# Patient Record
Sex: Female | Born: 1980 | Race: White | Hispanic: No | Marital: Married | Smoking: Never smoker
Health system: Southern US, Community
[De-identification: ages and names within clinical notes are randomized; demographics above are authoritative.]

## PROBLEM LIST (undated history)

## (undated) DIAGNOSIS — G500 Trigeminal neuralgia: Secondary | ICD-10-CM

## (undated) DIAGNOSIS — C44799 Other specified malignant neoplasm of skin of left lower limb, including hip: Secondary | ICD-10-CM

## (undated) DIAGNOSIS — C44301 Unspecified malignant neoplasm of skin of nose: Secondary | ICD-10-CM

## (undated) DIAGNOSIS — F32A Depression, unspecified: Secondary | ICD-10-CM

## (undated) DIAGNOSIS — F329 Major depressive disorder, single episode, unspecified: Secondary | ICD-10-CM

## (undated) HISTORY — DX: Depression, unspecified: F32.A

## (undated) HISTORY — DX: Major depressive disorder, single episode, unspecified: F32.9

## (undated) HISTORY — DX: Other specified malignant neoplasm of skin of left lower limb, including hip: C44.799

## (undated) HISTORY — PX: OTHER SURGICAL HISTORY: SHX169

## (undated) HISTORY — DX: Trigeminal neuralgia: G50.0

## (undated) HISTORY — DX: Unspecified malignant neoplasm of skin of nose: C44.301

---

## 2017-06-08 ENCOUNTER — Encounter (INDEPENDENT_AMBULATORY_CARE_PROVIDER_SITE_OTHER): Payer: Self-pay

## 2017-06-08 ENCOUNTER — Ambulatory Visit (INDEPENDENT_AMBULATORY_CARE_PROVIDER_SITE_OTHER): Payer: 59 | Admitting: Internal Medicine

## 2017-06-08 ENCOUNTER — Encounter: Payer: Self-pay | Admitting: Internal Medicine

## 2017-06-08 VITALS — BP 104/66 | HR 66 | Temp 98.0°F | Ht 66.0 in | Wt 213.5 lb

## 2017-06-08 DIAGNOSIS — F32A Depression, unspecified: Secondary | ICD-10-CM | POA: Insufficient documentation

## 2017-06-08 DIAGNOSIS — Z808 Family history of malignant neoplasm of other organs or systems: Secondary | ICD-10-CM

## 2017-06-08 DIAGNOSIS — F419 Anxiety disorder, unspecified: Secondary | ICD-10-CM

## 2017-06-08 DIAGNOSIS — F329 Major depressive disorder, single episode, unspecified: Secondary | ICD-10-CM

## 2017-06-08 MED ORDER — CITALOPRAM HYDROBROMIDE 40 MG PO TABS: 40.0000 mg | ORAL_TABLET | Freq: Every day | ORAL | 3 refills | Status: DC

## 2017-06-08 NOTE — Patient Instructions (Signed)

## 2017-06-08 NOTE — Assessment & Plan Note (Signed)
Chronic but stable on Celexa, refilled today Continue Xanax prn

## 2017-06-08 NOTE — Progress Notes (Signed)
HPI  Pt presents to the clinic today to establish care and for management of the conditions listed below. She is transferring care from Dr. Sharlett Iles in [place], [place].  Anxiety and Depression: Triggered by sugars, she also has a family history of anxiety and depression. She is taking Celexa daily as prescribed. She takes Xanax 1-2 times per month. She would like a refill of her Celexa today.  She is requesting a referral to dermatology for a general skin check, given her family history of melanoma.   Flu: never Tetanus: > 10 years ago Pap Smear: ? 2011 Dentist: as needed  Past Medical History:  Diagnosis Date  . Depression   . Dermatofibrosarcoma protuberans of lower extremity, left     Current Outpatient Prescriptions  Medication Sig Dispense Refill  . ALPRAZolam (XANAX) 0.25 MG tablet Take 0.25-0.5 mg by mouth every 6 (six) hours as needed for anxiety.    . citalopram (CELEXA) 40 MG tablet Take 40 mg by mouth daily.     No current facility-administered medications for this visit.     Allergies  Allergen Reactions  . Ceclor [Cefaclor] Hives    Family History  Problem Relation Age of Onset  . Diabetes Mother   . Hypertension Mother   . Depression Mother   . Melanoma Mother   . Stroke Father   . Depression Father   . Hypertension Father   . Melanoma Father   . Dementia Maternal Grandmother   . Depression Maternal Grandmother   . Melanoma Maternal Grandfather   . Heart disease Paternal Grandmother     Social History   Social History  . Marital status: Married    Spouse name: N/A  . Number of children: N/A  . Years of education: N/A   Occupational History  . Not on file.   Social History Main Topics  . Smoking status: Never Smoker  . Smokeless tobacco: Never Used  . Alcohol use No  . Drug use: Unknown  . Sexual activity: Not on file   Other Topics Concern  . Not on file   Social History Narrative  . No narrative on file    ROS:  Constitutional:  Denies fever, malaise, fatigue, headache or abrupt weight changes.  HEENT: Denies eye pain, eye redness, ear pain, ringing in the ears, wax buildup, runny nose, nasal congestion, bloody nose, or sore throat. Respiratory: Denies difficulty breathing, shortness of breath, cough or sputum production.   Cardiovascular: Denies chest pain, chest tightness, palpitations or swelling in the hands or feet.  Gastrointestinal: Denies abdominal pain, bloating, constipation, diarrhea or blood in the stool.  GU: Denies frequency, urgency, pain with urination, blood in urine, odor or discharge. Musculoskeletal: Denies decrease in range of motion, difficulty with gait, muscle pain or joint pain and swelling.  Skin: Denies redness, rashes, lesions or ulcercations.  Neurological: Denies dizziness, difficulty with memory, difficulty with speech or problems with balance and coordination.  Psych: Pt reports anxiety and depression. Denies SI/HI.  No other specific complaints in a complete review of systems (except as listed in HPI above).  PE: BP 104/66   Pulse 66   Temp 98 F (36.7 C) (Oral)   Ht 5\' 6"  (1.676 m)   Wt 213 lb 8 oz (96.8 kg)   LMP 05/18/2017   SpO2 98%   BMI 34.46 kg/m   Wt Readings from Last 3 Encounters:  06/08/17 213 lb 8 oz (96.8 kg)    General: Appears her stated age, obese in NAD.  Skin: Dry and intact. Cardiovascular: Normal rate and rhythm. S1,S2 noted.  No murmur, rubs or gallops noted.  Pulmonary/Chest: Normal effort and positive vesicular breath sounds. No respiratory distress. No wheezes, rales or ronchi noted.  Neurological: Alert and oriented.  Psychiatric: Mood and affect normal. Behavior is normal. Judgment and thought content normal.     Assessment and Plan:  Family History of Melanoma:  Referral to dermatology placed  Make an appt for your annual exam Webb Silversmith, NP

## 2018-06-14 ENCOUNTER — Ambulatory Visit (INDEPENDENT_AMBULATORY_CARE_PROVIDER_SITE_OTHER): Payer: BC Managed Care – PPO | Admitting: Internal Medicine

## 2018-06-14 ENCOUNTER — Encounter: Payer: Self-pay | Admitting: Internal Medicine

## 2018-06-14 DIAGNOSIS — F329 Major depressive disorder, single episode, unspecified: Secondary | ICD-10-CM

## 2018-06-14 DIAGNOSIS — F419 Anxiety disorder, unspecified: Secondary | ICD-10-CM

## 2018-06-14 DIAGNOSIS — F32A Depression, unspecified: Secondary | ICD-10-CM

## 2018-06-14 MED ORDER — ALPRAZOLAM 0.25 MG PO TABS: 0.2500 mg | ORAL_TABLET | Freq: Every day | ORAL | 0 refills | Status: DC | PRN

## 2018-06-14 NOTE — Progress Notes (Signed)
Subjective:    Patient ID: Carmen Mathis, female    DOB: 1981/04/18, 37 y.o.   MRN: [id number]  HPI  Pt presents to the clinic today to follow up anxiety and depression. This is a chronic issue for her. She feels like her symptoms are well controlled on Celexa. She ran out of Xanax last fall and would like a refill of that today. She reports recent increase in stress. She denies SI/HI.  Review of Systems      Past Medical History:  Diagnosis Date  . Depression   . Dermatofibrosarcoma protuberans of lower extremity, left     Current Outpatient Medications  Medication Sig Dispense Refill  . ALPRAZolam (XANAX) 0.25 MG tablet Take 0.25-0.5 mg by mouth every 6 (six) hours as needed for anxiety.    . citalopram (CELEXA) 40 MG tablet Take 1 tablet (40 mg total) by mouth daily. 90 tablet 3   No current facility-administered medications for this visit.     Allergies  Allergen Reactions  . Ceclor [Cefaclor] Hives    Family History  Problem Relation Age of Onset  . Diabetes Mother   . Hypertension Mother   . Depression Mother   . Melanoma Mother   . Stroke Father   . Depression Father   . Hypertension Father   . Melanoma Father   . Dementia Maternal Grandmother   . Depression Maternal Grandmother   . Melanoma Maternal Grandfather   . Heart disease Paternal Grandmother   . Anxiety disorder Sister   . Skin cancer Brother   . Anxiety disorder Daughter   . Diabetes Son     Social History   Socioeconomic History  . Marital status: Married    Spouse name: Not on file  . Number of children: Not on file  . Years of education: Not on file  . Highest education level: Not on file  Occupational History  . Not on file  Social Needs  . Financial resource strain: Not on file  . Food insecurity:    Worry: Not on file    Inability: Not on file  . Transportation needs:    Medical: Not on file    Non-medical: Not on file  Tobacco Use  . Smoking status: Never Smoker  .  Smokeless tobacco: Never Used  Substance and Sexual Activity  . Alcohol use: No  . Drug use: No  . Sexual activity: Yes  Lifestyle  . Physical activity:    Days per week: Not on file    Minutes per session: Not on file  . Stress: Not on file  Relationships  . Social connections:    Talks on phone: Not on file    Gets together: Not on file    Attends religious service: Not on file    Active member of club or organization: Not on file    Attends meetings of clubs or organizations: Not on file    Relationship status: Not on file  . Intimate partner violence:    Fear of current or ex partner: Not on file    Emotionally abused: Not on file    Physically abused: Not on file    Forced sexual activity: Not on file  Other Topics Concern  . Not on file  Social History Narrative  . Not on file     Constitutional: Denies fever, malaise, fatigue, headache or abrupt weight changes.  Psych: Pt reports anxiety and depression. Denies SI/HI.  No other specific complaints in a complete  review of systems (except as listed in HPI above).  Objective:   Physical Exam  Wt 222 lb (100.7 kg)   BMI 35.83 kg/m  Wt Readings from Last 3 Encounters:  06/14/18 222 lb (100.7 kg)  06/08/17 213 lb 8 oz (96.8 kg)    General: Appears her stated age, well developed, well nourished in NAD. Cardiovascular: Normal rate and rhythm. S1,S2 noted.  No murmur, rubs or gallops noted. Pulmonary/Chest: Normal effort and positive vesicular breath sounds. No respiratory distress. No wheezes, rales or ronchi noted.  Neurological: Alert and oriented. Psychiatric: Mood and affect normal. Behavior is normal. Judgment and thought content normal.       Assessment & Plan:

## 2018-06-14 NOTE — Assessment & Plan Note (Signed)
Continue Celexa and Xanax Refilled today Support offered

## 2018-06-14 NOTE — Patient Instructions (Signed)

## 2018-07-24 ENCOUNTER — Other Ambulatory Visit: Payer: Self-pay | Admitting: Internal Medicine

## 2018-11-09 ENCOUNTER — Other Ambulatory Visit: Payer: Self-pay | Admitting: Internal Medicine

## 2018-11-28 ENCOUNTER — Other Ambulatory Visit: Payer: Self-pay | Admitting: Neurology

## 2018-11-28 DIAGNOSIS — G518 Other disorders of facial nerve: Secondary | ICD-10-CM

## 2018-12-07 ENCOUNTER — Ambulatory Visit
Admission: RE | Admit: 2018-12-07 | Discharge: 2018-12-07 | Disposition: A | Discharge status: Home / Self Care | Payer: BC Managed Care – PPO | Source: Ambulatory Visit | Attending: Neurology | Admitting: Neurology

## 2018-12-07 DIAGNOSIS — G518 Other disorders of facial nerve: Secondary | ICD-10-CM | POA: Diagnosis present

## 2018-12-07 MED ORDER — GADOBUTROL 1 MMOL/ML IV SOLN
10.0000 mL | Freq: Once | INTRAVENOUS | Status: AC | PRN
  Administered 2018-12-07: 10 mL via INTRAVENOUS

## 2018-12-25 ENCOUNTER — Telehealth: Payer: Self-pay

## 2018-12-25 NOTE — Telephone Encounter (Signed)
Pt left v/m that pt was seen for annual in 05/2018 and pt last refill for citalopram was for # 30 with no refills. Pt request 3 mth refill on citalopram to [facility] [address] . I do not see where pt had annual exam in 05/2018.Please advise. Pt request cb.

## 2018-12-26 ENCOUNTER — Other Ambulatory Visit: Payer: Self-pay | Admitting: Internal Medicine

## 2018-12-28 MED ORDER — CITALOPRAM HYDROBROMIDE 40 MG PO TABS: 40.0000 mg | ORAL_TABLET | Freq: Every day | ORAL | 0 refills | Status: DC

## 2018-12-28 NOTE — Telephone Encounter (Signed)
Rx sent through [facility]scribe Pt has not had CPE ever completed, she was seen for Anxiety and Depression only, note to pharmacy states this will be the last refill

## 2019-04-15 ENCOUNTER — Other Ambulatory Visit: Payer: Self-pay

## 2019-04-15 ENCOUNTER — Ambulatory Visit: Payer: BC Managed Care – PPO | Admitting: Family Medicine

## 2019-04-15 ENCOUNTER — Encounter: Payer: Self-pay | Admitting: Family Medicine

## 2019-04-15 VITALS — BP 98/66 | HR 55 | Temp 98.5°F | Resp 18 | Ht 67.0 in | Wt 222.5 lb

## 2019-04-15 DIAGNOSIS — L237 Allergic contact dermatitis due to plants, except food: Secondary | ICD-10-CM | POA: Diagnosis not present

## 2019-04-15 MED ORDER — PREDNISONE 20 MG PO TABS: ORAL_TABLET | ORAL | 0 refills | Status: AC

## 2019-04-15 MED ORDER — ALPRAZOLAM 0.25 MG PO TABS: 0.2500 mg | ORAL_TABLET | Freq: Every day | ORAL | 0 refills | Status: DC | PRN

## 2019-04-15 NOTE — Patient Instructions (Addendum)
Poison Ivy - take oral steroids - continue calamine lotion - Benadryl for itching

## 2019-04-15 NOTE — Progress Notes (Signed)
   Subjective:     Carmen Mathis is a 38 y.o. female presenting for Rash (sx started on 6/ 25/2020 has itching, rash on arms, groin area, around abdomen area, some around breast area)     HPI  #Poison ivy - got it from touching son - has been doing cortisone, benadryl, hair dryer, calamine lotion - son broke out last weekend - is on a 10 day steroid course - locations: under bilateral breasts, abdomen, neck, arm, legs - still getting new lesions -    Review of Systems  Constitutional: Negative for chills and fever.  Skin: Positive for rash.     Social History   Tobacco Use  Smoking Status Never Smoker  Smokeless Tobacco Never Used        Objective:    BP Readings from Last 3 Encounters:  04/15/19 98/66  06/14/18 118/70  06/08/17 104/66   Wt Readings from Last 3 Encounters:  04/15/19 222 lb 8 oz (100.9 kg)  06/14/18 222 lb (100.7 kg)  06/08/17 213 lb 8 oz (96.8 kg)    BP 98/66   Pulse (!) 55   Temp 98.5 F (36.9 C)   Resp 18   Ht 5\' 7"  (1.702 m)   Wt 222 lb 8 oz (100.9 kg)   LMP 04/11/2019   BMI 34.85 kg/m    Physical Exam Constitutional:      General: She is not in acute distress.    Appearance: She is well-developed. She is not diaphoretic.  HENT:     Right Ear: External ear normal.     Left Ear: External ear normal.     Nose: Nose normal.  Eyes:     Conjunctiva/sclera: Conjunctivae normal.  Neck:     Musculoskeletal: Neck supple.  Cardiovascular:     Rate and Rhythm: Normal rate.  Pulmonary:     Effort: Pulmonary effort is normal.  Skin:    General: Skin is warm and dry.     Capillary Refill: Capillary refill takes less than 2 seconds.     Comments: Several erythematous patches of skin on the abdomen, arms, under the breast. Most lesions are macular papular with the left elbow with active vesicles and yellow crusting present.   Neurological:     Mental Status: She is alert. Mental status is at baseline.  Psychiatric:        Mood and  Affect: Mood normal.        Behavior: Behavior normal.           Assessment & Plan:   Problem List Items Addressed This Visit      Musculoskeletal and Integument   Poison ivy - Primary    Given extensive body coverage will do oral steroids. Some lesions under the breast which started - could also be yeast, but discussed treating as though poison ivy and to follow-up if this area does not improve.       Relevant Medications   predniSONE (DELTASONE) 20 MG tablet       Return if symptoms worsen or fail to improve.  Lesleigh Noe, MD

## 2019-04-15 NOTE — Assessment & Plan Note (Signed)
Given extensive body coverage will do oral steroids. Some lesions under the breast which started - could also be yeast, but discussed treating as though poison ivy and to follow-up if this area does not improve.

## 2019-04-15 NOTE — Telephone Encounter (Signed)
Patient requesting refill on Xanax. Per last telephone note in March it was noted patient needs to make a CPE appointment. This is scheduled for 05/27/2019. Please review refill request.  Last filled on 06/14/2018 for 30 tablets with 0 refill. Thank you

## 2019-05-27 ENCOUNTER — Ambulatory Visit (INDEPENDENT_AMBULATORY_CARE_PROVIDER_SITE_OTHER): Payer: BC Managed Care – PPO | Admitting: Internal Medicine

## 2019-05-27 ENCOUNTER — Encounter: Payer: Self-pay | Admitting: Internal Medicine

## 2019-05-27 VITALS — Wt 220.0 lb

## 2019-05-27 DIAGNOSIS — Z0000 Encounter for general adult medical examination without abnormal findings: Secondary | ICD-10-CM

## 2019-05-27 DIAGNOSIS — F329 Major depressive disorder, single episode, unspecified: Secondary | ICD-10-CM

## 2019-05-27 DIAGNOSIS — L989 Disorder of the skin and subcutaneous tissue, unspecified: Secondary | ICD-10-CM | POA: Diagnosis not present

## 2019-05-27 DIAGNOSIS — F419 Anxiety disorder, unspecified: Secondary | ICD-10-CM | POA: Diagnosis not present

## 2019-05-27 DIAGNOSIS — F32A Depression, unspecified: Secondary | ICD-10-CM

## 2019-05-27 MED ORDER — CITALOPRAM HYDROBROMIDE 40 MG PO TABS: 40.0000 mg | ORAL_TABLET | Freq: Every day | ORAL | 3 refills | Status: DC

## 2019-05-27 MED ORDER — ALPRAZOLAM 0.25 MG PO TABS: 0.2500 mg | ORAL_TABLET | Freq: Every day | ORAL | 0 refills | Status: DC | PRN

## 2019-05-27 NOTE — Assessment & Plan Note (Signed)
Support offered today Continue Citalopram and Xanax Will obtain CSA and UDS at next annual

## 2019-05-27 NOTE — Patient Instructions (Signed)
Health Maintenance, Female Adopting a healthy lifestyle and getting preventive care are important in promoting health and wellness. Ask your health care provider about:  The right schedule for you to have regular tests and exams.  Things you can do on your own to prevent diseases and keep yourself healthy. What should I know about diet, weight, and exercise? Eat a healthy diet   Eat a diet that includes plenty of vegetables, fruits, low-fat dairy products, and lean protein.  Do not eat a lot of foods that are high in solid fats, added sugars, or sodium. Maintain a healthy weight Body mass index (BMI) is used to identify weight problems. It estimates body fat based on height and weight. Your health care provider can help determine your BMI and help you achieve or maintain a healthy weight. Get regular exercise Get regular exercise. This is one of the most important things you can do for your health. Most adults should:  Exercise for at least 150 minutes each week. The exercise should increase your heart rate and make you sweat (moderate-intensity exercise).  Do strengthening exercises at least twice a week. This is in addition to the moderate-intensity exercise.  Spend less time sitting. Even light physical activity can be beneficial. Watch cholesterol and blood lipids Have your blood tested for lipids and cholesterol at 40 years of age, then have this test every 5 years. Have your cholesterol levels checked more often if:  Your lipid or cholesterol levels are high.  You are older than 40 years of age.  You are at high risk for heart disease. What should I know about cancer screening? Depending on your health history and family history, you may need to have cancer screening at various ages. This may include screening for:  Breast cancer.  Cervical cancer.  Colorectal cancer.  Skin cancer.  Lung cancer. What should I know about heart disease, diabetes, and high blood  pressure? Blood pressure and heart disease  High blood pressure causes heart disease and increases the risk of stroke. This is more likely to develop in people who have high blood pressure readings, are of African descent, or are overweight.  Have your blood pressure checked: ? Every 3-5 years if you are 18-39 years of age. ? Every year if you are 40 years old or older. Diabetes Have regular diabetes screenings. This checks your fasting blood sugar level. Have the screening done:  Once every three years after age 40 if you are at a normal weight and have a low risk for diabetes.  More often and at a younger age if you are overweight or have a high risk for diabetes. What should I know about preventing infection? Hepatitis B If you have a higher risk for hepatitis B, you should be screened for this virus. Talk with your health care provider to find out if you are at risk for hepatitis B infection. Hepatitis C Testing is recommended for:  Everyone born from 1945 through 1965.  Anyone with known risk factors for hepatitis C. Sexually transmitted infections (STIs)  Get screened for STIs, including gonorrhea and chlamydia, if: ? You are sexually active and are younger than 40 years of age. ? You are older than 40 years of age and your health care provider tells you that you are at risk for this type of infection. ? Your sexual activity has changed since you were last screened, and you are at increased risk for chlamydia or gonorrhea. Ask your health care provider if   you are at risk.  Ask your health care provider about whether you are at high risk for HIV. Your health care provider may recommend a prescription medicine to help prevent HIV infection. If you choose to take medicine to prevent HIV, you should first get tested for HIV. You should then be tested every 3 months for as long as you are taking the medicine. Pregnancy  If you are about to stop having your period (premenopausal) and  you may become pregnant, seek counseling before you get pregnant.  Take 400 to 800 micrograms (mcg) of folic acid every day if you become pregnant.  Ask for birth control (contraception) if you want to prevent pregnancy. Osteoporosis and menopause Osteoporosis is a disease in which the bones lose minerals and strength with aging. This can result in bone fractures. If you are 65 years old or older, or if you are at risk for osteoporosis and fractures, ask your health care provider if you should:  Be screened for bone loss.  Take a calcium or vitamin D supplement to lower your risk of fractures.  Be given hormone replacement therapy (HRT) to treat symptoms of menopause. Follow these instructions at home: Lifestyle  Do not use any products that contain nicotine or tobacco, such as cigarettes, e-cigarettes, and chewing tobacco. If you need help quitting, ask your health care provider.  Do not use street drugs.  Do not share needles.  Ask your health care provider for help if you need support or information about quitting drugs. Alcohol use  Do not drink alcohol if: ? Your health care provider tells you not to drink. ? You are pregnant, may be pregnant, or are planning to become pregnant.  If you drink alcohol: ? Limit how much you use to 0-1 drink a day. ? Limit intake if you are breastfeeding.  Be aware of how much alcohol is in your drink. In the [place] one drink equals one 12 oz bottle of beer (355 mL), one 5 oz glass of wine (148 mL), or one 1 oz glass of hard liquor (44 mL). General instructions  Schedule regular health, dental, and eye exams.  Stay current with your vaccines.  Tell your health care provider if: ? You often feel depressed. ? You have ever been abused or do not feel safe at home. Summary  Adopting a healthy lifestyle and getting preventive care are important in promoting health and wellness.  Follow your health care provider's instructions about healthy  diet, exercising, and getting tested or screened for diseases.  Follow your health care provider's instructions on monitoring your cholesterol and blood pressure. This information is not intended to replace advice given to you by your health care provider. Make sure you discuss any questions you have with your health care provider. Document Released: 04/18/2011 Document Revised: 09/26/2018 Document Reviewed: 09/26/2018 Elsevier Patient Education  2020 [employer] Inc[employer]  

## 2019-05-27 NOTE — Progress Notes (Signed)
Virtual Visit via Video Note  I connected with Carmen Mathis on 05/27/19 at  2:30 PM EDT by a video enabled telemedicine application and verified that I am speaking with the correct person using two identifiers.  Location: Patient: Home Provider: Office   I discussed the limitations of evaluation and management by telemedicine and the availability of in person appointments. The patient expressed understanding and agreed to proceed.  History of Present Illness:  Pt due for her annual exam. She is also due to follow up chronic conditions.  Anxiety and Depression: Triggered by sugar. She is taking Citalopram as prescribed. She takes Xanax 1-2 times per month. She is not currently seeing a therapist. She denies SI/HI. There is no CSA and UDS on file.  She would also like a referral to dermatology for a general skin check.  Flu: never Tetanus: > 10 years ago Pap Smear: ? 2011 Mammogram: never Vision Screening: annually Dentist: biannually  Diet: She does eat some meat. She consumes fruits and veggies daily. She does not eat fried foods. She drinks mostly water, occassional soda. Exercise: walking, resistance training, P90X 5 days per week.   Past Medical History:  Diagnosis Date  . Depression   . Dermatofibrosarcoma protuberans of lower extremity, left     Current Outpatient Medications  Medication Sig Dispense Refill  . ALPRAZolam (XANAX) 0.25 MG tablet Take 1 tablet (0.25 mg total) by mouth daily as needed for anxiety. 30 tablet 0  . citalopram (CELEXA) 40 MG tablet Take 1 tablet (40 mg total) by mouth daily. MUST SCHEDULE PHYSICAL EXAM 30 tablet 0   No current facility-administered medications for this visit.     Allergies  Allergen Reactions  . Ceclor [Cefaclor] Hives    Family History  Problem Relation Age of Onset  . Diabetes Mother   . Hypertension Mother   . Depression Mother   . Melanoma Mother   . Stroke Father   . Depression Father   . Hypertension  Father   . Melanoma Father   . Dementia Maternal Grandmother   . Depression Maternal Grandmother   . Melanoma Maternal Grandfather   . Heart disease Paternal Grandmother   . Anxiety disorder Sister   . Skin cancer Brother   . Anxiety disorder Daughter   . Diabetes Son     Social History   Socioeconomic History  . Marital status: Married    Spouse name: Not on file  . Number of children: Not on file  . Years of education: Not on file  . Highest education level: Not on file  Occupational History  . Not on file  Social Needs  . Financial resource strain: Not on file  . Food insecurity    Worry: Not on file    Inability: Not on file  . Transportation needs    Medical: Not on file    Non-medical: Not on file  Tobacco Use  . Smoking status: Never Smoker  . Smokeless tobacco: Never Used  Substance and Sexual Activity  . Alcohol use: No  . Drug use: No  . Sexual activity: Yes  Lifestyle  . Physical activity    Days per week: Not on file    Minutes per session: Not on file  . Stress: Not on file  Relationships  . Social Herbalist on phone: Not on file    Gets together: Not on file    Attends religious service: Not on file    Active member of  club or organization: Not on file    Attends meetings of clubs or organizations: Not on file    Relationship status: Not on file  . Intimate partner violence    Fear of current or ex partner: Not on file    Emotionally abused: Not on file    Physically abused: Not on file    Forced sexual activity: Not on file  Other Topics Concern  . Not on file  Social History Narrative  . Not on file     Constitutional: Denies fever, malaise, fatigue, headache or abrupt weight changes.  HEENT: Denies eye pain, eye redness, ear pain, ringing in the ears, wax buildup, runny nose, nasal congestion, bloody nose, or sore throat. Respiratory: Denies difficulty breathing, shortness of breath, cough or sputum production.    Cardiovascular: Denies chest pain, chest tightness, palpitations or swelling in the hands or feet.  Gastrointestinal: Denies abdominal pain, bloating, constipation, diarrhea or blood in the stool.  GU: Denies urgency, frequency, pain with urination, burning sensation, blood in urine, odor or discharge. Musculoskeletal: Denies decrease in range of motion, difficulty with gait, muscle pain or joint pain and swelling.  Skin: Denies redness, rashes, lesions or ulcercations.  Neurological: Denies dizziness, difficulty with memory, difficulty with speech or problems with balance and coordination.  Psych: Pt has a history of anxiety and depression. Denies SI/HI.  No other specific complaints in a complete review of systems (except as listed in HPI above).     Observations/Objective:  Wt Readings from Last 3 Encounters:  04/15/19 222 lb 8 oz (100.9 kg)  06/14/18 222 lb (100.7 kg)  06/08/17 213 lb 8 oz (96.8 kg)    General: Appears her stated age, well developed, well nourished in NAD. HEENT: Head: normal shape and size; Eyes: sclera white, no icterus, conjunctiva pink, PERRLA and EOMs intact;  Pulmonary/Chest: Normal effort. No respiratory distress.  Musculoskeletal: No difficulty with gait.  Neurological: Alert and oriented. Psychiatric: Mood and affect normal. Behavior is normal. Judgment and thought content normal.     Assessment and Plan:  Preventative Health Maintenance:  Encouraged her to get a flu shot in the fall Advised her to schedule a nurse visit for Tdap Pap smear overdue- she will likely just get with next annual exam Will start mammogram screening at 26 Encouraged her to consume a balanced diet and exercise regimen Advised her to see an eye doctor, dentist annually Will have her schedule lab only appt for CBC, CMET, Lipid and Vit D  Skin Lesions, Generalized:  Referral to dermatology placed Encouraged use of sunscreen daily.  RTC in 1 year, sooner if  needed  Follow Up Instructions:    I discussed the assessment and treatment plan with the patient. The patient was provided an opportunity to ask questions and all were answered. The patient agreed with the plan and demonstrated an understanding of the instructions.   The patient was advised to call back or seek an in-person evaluation if the symptoms worsen or if the condition fails to improve as anticipated.   Webb Silversmith, NP

## 2019-05-29 ENCOUNTER — Other Ambulatory Visit: Payer: Self-pay

## 2019-05-29 DIAGNOSIS — Z20822 Contact with and (suspected) exposure to covid-19: Secondary | ICD-10-CM

## 2019-05-30 LAB — NOVEL CORONAVIRUS, NAA: SARS-CoV-2, NAA: DETECTED — AB

## 2019-06-04 ENCOUNTER — Telehealth: Payer: Self-pay

## 2019-06-04 NOTE — Telephone Encounter (Signed)
Left message to call back in regards to the referral.

## 2020-03-20 ENCOUNTER — Telehealth: Payer: Self-pay

## 2020-03-20 NOTE — Telephone Encounter (Signed)
If she can tolerate without while pregnant, that is always best but research shows that if the mother's mental health will significantly be impaired by stopping the medication, it should be continued at the lowest effective dose. If she is going to stop, she should taper and not stop abruptly.

## 2020-03-20 NOTE — Telephone Encounter (Signed)
Pt called to report that she just found out that she is pregnant x 2 days ago.... she is currently taking Celexa and would like to know if this medication will need to be d/c... she has not taken medication since finding out she was pregnant... please advise

## 2020-03-27 NOTE — Telephone Encounter (Signed)
Left message on voicemail.

## 2020-04-21 ENCOUNTER — Other Ambulatory Visit: Payer: Self-pay

## 2020-04-21 ENCOUNTER — Encounter: Payer: Self-pay | Admitting: Obstetrics and Gynecology

## 2020-04-21 ENCOUNTER — Ambulatory Visit (INDEPENDENT_AMBULATORY_CARE_PROVIDER_SITE_OTHER): Payer: BC Managed Care – PPO | Admitting: Obstetrics and Gynecology

## 2020-04-21 VITALS — BP 114/73 | HR 57 | Ht 67.0 in | Wt 239.5 lb

## 2020-04-21 DIAGNOSIS — O09521 Supervision of elderly multigravida, first trimester: Secondary | ICD-10-CM | POA: Diagnosis not present

## 2020-04-21 DIAGNOSIS — F419 Anxiety disorder, unspecified: Secondary | ICD-10-CM | POA: Diagnosis not present

## 2020-04-21 DIAGNOSIS — F32A Depression, unspecified: Secondary | ICD-10-CM

## 2020-04-21 DIAGNOSIS — O09299 Supervision of pregnancy with other poor reproductive or obstetric history, unspecified trimester: Secondary | ICD-10-CM

## 2020-04-21 DIAGNOSIS — N926 Irregular menstruation, unspecified: Secondary | ICD-10-CM

## 2020-04-21 DIAGNOSIS — F329 Major depressive disorder, single episode, unspecified: Secondary | ICD-10-CM

## 2020-04-21 LAB — POCT URINE PREGNANCY: Preg Test, Ur: POSITIVE — AB

## 2020-04-21 NOTE — Progress Notes (Signed)
Subjective:    Carmen Mathis is a 39 y.o. female who presents for evaluation of amenorrhea.  She was referred from her PCP Webb Silversmith, NP at [facility]). She believes she could be pregnant. Pregnancy is desired but was unplanned. Sexual Activity: single partner, contraception: none. Current symptoms also include: breast tenderness, nausea and positive home pregnancy test. Last period was normal.    The following portions of the patient's history were reviewed and updated as appropriate:   OB History  Gravida Para Term Preterm AB Living  8 5 5   3 5   SAB TAB Ectopic Multiple Live Births  3       5    # Outcome Date GA Lbr Len/2nd Weight Sex Delivery Anes PTL Lv  8 SAB 2013          7 Term 07/05/10   9 lb 15 oz (4.508 kg) M Vag-Spont EPI  LIV  6 Term 01/12/08   9 lb 5 oz (4.224 kg) F Vag-Spont EPI  LIV  5 Term 02/25/06   9 lb 9 oz (4.338 kg) M Vag-Spont Spinal, EPI  LIV  4 Term 07/17/04   9 lb 9 oz (4.338 kg) F Vag-Spont EPI, Spinal  LIV  3 Term 09/02/02   8 lb 12 oz (3.969 kg) F Vag-Spont EPI, Spinal  LIV  2 SAB 2002          1 SAB 2002            She  has a past medical history of Depression, Dermatofibrosarcoma protuberans of lower extremity, left, and Skin cancer of nose.   She  has a past surgical history that includes no surgical history.   Her family history includes Anxiety disorder in her daughter and sister; Dementia in her maternal grandmother; Depression in her father, maternal grandmother, and mother; Diabetes in her mother and son; Heart disease in her paternal grandmother; Hypertension in her father and mother; Melanoma in her father, maternal grandfather, and mother; Skin cancer in her brother; Stroke in her father.   She  reports that she has never smoked. She has never used smokeless tobacco. She reports that she does not drink alcohol and does not use drugs.   Current Outpatient Medications on File Prior to Visit  Medication Sig Dispense Refill    . acetaminophen (TYLENOL) 500 MG tablet Take 500 mg by mouth every 6 (six) hours as needed.    . ALPRAZolam (XANAX) 0.25 MG tablet Take 1 tablet (0.25 mg total) by mouth daily as needed for anxiety. 30 tablet 0  . citalopram (CELEXA) 40 MG tablet Take 1 tablet (40 mg total) by mouth daily. 90 tablet 3  . Multiple Vitamins-Minerals (WOMENS MULTI VITAMIN & MINERAL PO) Take by mouth.     No current facility-administered medications on file prior to visit.   She is allergic to ceclor [cefaclor]..   Review of Systems Pertinent items noted in HPI and remainder of comprehensive ROS otherwise negative.     Objective:    BP 114/73   Pulse (!) 57   Ht 5\' 7"  (1.702 m)   Wt 239 lb 8 oz (108.6 kg)   LMP  (LMP Unknown)   BMI 37.51 kg/m  General: alert, no distress and no acute distress    Lab Review Urine HCG: positive    Assessment:   Absence of menstruation.  Advanced maternal age  Anxiety and depression History of fetal macrosomia  Plan:   - Pregnancy  Test: Positive: EDC: 11/25/2020, with EGA [redacted]w[redacted]d. . Briefly discussed pre-natal care options. First trimester information given.  Encouraged well-balanced diet, plenty of rest when needed, pre-natal vitamins daily and walking for exercise. Discussed self-help for nausea, avoiding OTC medications until consulting provider or pharmacist, other than Tylenol as needed, minimal caffeine (1-2 cups daily) and avoiding alcohol. She will schedule her initial OB visit in the next month, and her NOB intake in 1-2 weeks.  Feel free to call with any questions. - Discussed recommendations for early genetic screening due to advanced maternal age.  - Anxiety and depression, currently on Celexa. Notes she has not taken her Xanax in ~ 4 months. Continued to advise cessation with Xanax.  Reports that she tried to wean from Celexa on discovery of pregnancy (cut dose in half), but after 2 weeks became very symptomatic and had to resume. Discussed that Celexa was a  Category C medication, and if benefits outweighed risks, could continue during the pregnancy. Discussed risks and benefits. Patient will continue at current dosing.  - Patient has had pelvis tried with several macrosomia infants (x 4). Should consider early glucola or A1c to rule out diabetes.    Rubie Maid, MD [facility]

## 2020-04-21 NOTE — Progress Notes (Signed)
PT is present today for confirmation of pregnancy. Pt LMP unknown. UPT done today results were positive. Pt stated that she is doing well no complaints.

## 2020-04-21 NOTE — Patient Instructions (Addendum)
First Trimester of Pregnancy  The first trimester of pregnancy is from week 1 until the end of week 13 (months 1 through 3). During this time, your baby will begin to develop inside you. At 6-8 weeks, the eyes and face are formed, and the heartbeat can be seen on ultrasound. At the end of 12 weeks, all the baby's organs are formed. Prenatal care is all the medical care you receive before the birth of your baby. Make sure you get good prenatal care and follow all of your doctor's instructions. Follow these instructions at home: Medicines  Take over-the-counter and prescription medicines only as told by your doctor. Some medicines are safe and some medicines are not safe during pregnancy.  Take a prenatal vitamin that contains at least 600 micrograms (mcg) of folic acid.  If you have trouble pooping (constipation), take medicine that will make your stool soft (stool softener) if your doctor approves. Eating and drinking   Eat regular, healthy meals.  Your doctor will tell you the amount of weight gain that is right for you.  Avoid raw meat and uncooked cheese.  If you feel sick to your stomach (nauseous) or throw up (vomit): ? Eat 4 or 5 small meals a day instead of 3 large meals. ? Try eating a few soda crackers. ? Drink liquids between meals instead of during meals.  To prevent constipation: ? Eat foods that are high in fiber, like fresh fruits and vegetables, whole grains, and beans. ? Drink enough fluids to keep your pee (urine) clear or pale yellow. Activity  Exercise only as told by your doctor. Stop exercising if you have cramps or pain in your lower belly (abdomen) or low back.  Do not exercise if it is too hot, too humid, or if you are in a place of great height (high altitude).  Try to avoid standing for long periods of time. Move your legs often if you must stand in one place for a long time.  Avoid heavy lifting.  Wear low-heeled shoes. Sit and stand up  straight.  You can have sex unless your doctor tells you not to. Relieving pain and discomfort  Wear a good support bra if your breasts are sore.  Take warm water baths (sitz baths) to soothe pain or discomfort caused by hemorrhoids. Use hemorrhoid cream if your doctor says it is okay.  Rest with your legs raised if you have leg cramps or low back pain.  If you have puffy, bulging veins (varicose veins) in your legs: ? Wear support hose or compression stockings as told by your doctor. ? Raise (elevate) your feet for 15 minutes, 3-4 times a day. ? Limit salt in your food. Prenatal care  Schedule your prenatal visits by the twelfth week of pregnancy.  Write down your questions. Take them to your prenatal visits.  Keep all your prenatal visits as told by your doctor. This is important. Safety  Wear your seat belt at all times when driving.  Make a list of emergency phone numbers. The list should include numbers for family, friends, the hospital, and police and fire departments. General instructions  Ask your doctor for a referral to a local prenatal class. Begin classes no later than at the start of month 6 of your pregnancy.  Ask for help if you need counseling or if you need help with nutrition. Your doctor can give you advice or tell you where to go for help.  Do not use hot tubs, steam   rooms, or saunas.  Do not douche or use tampons or scented sanitary pads.  Do not cross your legs for long periods of time.  Avoid all herbs and alcohol. Avoid drugs that are not approved by your doctor.  Do not use any tobacco products, including cigarettes, chewing tobacco, and electronic cigarettes. If you need help quitting, ask your doctor. You may get counseling or other support to help you quit.  Avoid cat litter boxes and soil used by cats. These carry germs that can cause birth defects in the baby and can cause a loss of your baby (miscarriage) or stillbirth.  Visit your dentist.  At home, brush your teeth with a soft toothbrush. Be gentle when you floss. Contact a doctor if:  You are dizzy.  You have mild cramps or pressure in your lower belly.  You have a nagging pain in your belly area.  You continue to feel sick to your stomach, you throw up, or you have watery poop (diarrhea).  You have a bad smelling fluid coming from your vagina.  You have pain when you pee (urinate).  You have increased puffiness (swelling) in your face, hands, legs, or ankles. Get help right away if:  You have a fever.  You are leaking fluid from your vagina.  You have spotting or bleeding from your vagina.  You have very bad belly cramping or pain.  You gain or lose weight rapidly.  You throw up blood. It may look like coffee grounds.  You are around people who have [place] measles, fifth disease, or chickenpox.  You have a very bad headache.  You have shortness of breath.  You have any kind of trauma, such as from a fall or a car accident. Summary  The first trimester of pregnancy is from week 1 until the end of week 13 (months 1 through 3).  To take care of yourself and your unborn baby, you will need to eat healthy meals, take medicines only if your doctor tells you to do so, and do activities that are safe for you and your baby.  Keep all follow-up visits as told by your doctor. This is important as your doctor will have to ensure that your baby is healthy and growing well. This information is not intended to replace advice given to you by your health care provider. Make sure you discuss any questions you have with your health care provider. Document Revised: 01/24/2019 Document Reviewed: 10/11/2016 Elsevier Patient Education  2020 [employer].  Commonly Asked Questions During Pregnancy  Cats: A parasite can be excreted in cat feces.  To avoid exposure you need to have another person empty the little box.  If you must empty the litter box you will need to wear  gloves.  Wash your hands after handling your cat.  This parasite can also be found in raw or undercooked meat so this should also be avoided.  Colds, Sore Throats, Flu: Please check your medication sheet to see what you can take for symptoms.  If your symptoms are unrelieved by these medications please call the office.  Dental Work: Most any dental work Investment banker, corporate recommends is permitted.  X-rays should only be taken during the first trimester if absolutely necessary.  Your abdomen should be shielded with a lead apron during all x-rays.  Please notify your provider prior to receiving any x-rays.  Novocaine is fine; gas is not recommended.  If your dentist requires a note from [place] prior to dental work  please call the office and we will provide one for you.  Exercise: Exercise is an important part of staying healthy during your pregnancy.  You may continue most exercises you were accustomed to prior to pregnancy.  Later in your pregnancy you will most likely notice you have difficulty with activities requiring balance like riding a bicycle.  It is important that you listen to your body and avoid activities that put you at a higher risk of falling.  Adequate rest and staying well hydrated are a must!  If you have questions about the safety of specific activities ask your provider.    Exposure to Children with illness: Try to avoid obvious exposure; report any symptoms to [place] when noted,  If you have chicken pos, red measles or mumps, you should be immune to these diseases.   Please do not take any vaccines while pregnant unless you have checked with your OB provider.  Fetal Movement: After 28 weeks we recommend you do "kick counts" twice daily.  Lie or sit down in a calm quiet environment and count your baby movements "kicks".  You should feel your baby at least 10 times per hour.  If you have not felt 10 kicks within the first hour get up, walk around and have something sweet to eat or drink then repeat for  an additional hour.  If count remains less than 10 per hour notify your provider.  Fumigating: Follow your pest control agent's advice as to how long to stay out of your home.  Ventilate the area well before re-entering.  Hemorrhoids:   Most over-the-counter preparations can be used during pregnancy.  Check your medication to see what is safe to use.  It is important to use a stool softener or fiber in your diet and to drink lots of liquids.  If hemorrhoids seem to be getting worse please call the office.   Hot Tubs:  Hot tubs Jacuzzis and saunas are not recommended while pregnant.  These increase your internal body temperature and should be avoided.  Intercourse:  Sexual intercourse is safe during pregnancy as long as you are comfortable, unless otherwise advised by your provider.  Spotting may occur after intercourse; report any bright red bleeding that is heavier than spotting.  Labor:  If you know that you are in labor, please go to the hospital.  If you are unsure, please call the office and let us help you decide what to do.  Lifting, straining, etc:  If your job requires heavy lifting or straining please check with your provider for any limitations.  Generally, you should not lift items heavier than that you can lift simply with your hands and arms (no back muscles)  Painting:  Paint fumes do not harm your pregnancy, but may make you ill and should be avoided if possible.  Latex or water based paints have less odor than oils.  Use adequate ventilation while painting.  Permanents & Hair Color:  Chemicals in hair dyes are not recommended as they cause increase hair dryness which can increase hair loss during pregnancy.  " Highlighting" and permanents are allowed.  Dye may be absorbed differently and permanents may not hold as well during pregnancy.  Sunbathing:  Use a sunscreen, as skin burns easily during pregnancy.  Drink plenty of fluids; avoid over heating.  Tanning Beds:  Because their  possible side effects are still unknown, tanning beds are not recommended.  Ultrasound Scans:  Routine ultrasounds are performed at approximately 20 weeks.  You will be able to see your baby's general anatomy an if you would like to know the gender this can usually be determined as well.  If it is questionable when you conceived you may also receive an ultrasound early in your pregnancy for dating purposes.  Otherwise ultrasound exams are not routinely performed unless there is a medical necessity.  Although you can request a scan we ask that you pay for it when conducted because insurance does not cover " patient request" scans.  Work: If your pregnancy proceeds without complications you may work until your due date, unless your physician or employer advises otherwise.  Round Ligament Pain/Pelvic Discomfort:  Sharp, shooting pains not associated with bleeding are fairly common, usually occurring in the second trimester of pregnancy.  They tend to be worse when standing up or when you remain standing for long periods of time.  These are the result of pressure of certain pelvic ligaments called "round ligaments".  Rest, Tylenol and heat seem to be the most effective relief.  As the womb and fetus grow, they rise out of the pelvis and the discomfort improves.  Please notify the office if your pain seems different than that described.  It may represent a more serious condition.  Common Medications Safe in Pregnancy  Acne:      Constipation:  Benzoyl Peroxide     Colace  Clindamycin      Dulcolax Suppository  Topica Erythromycin     Fibercon  Salicylic Acid      Metamucil         Miralax AVOID:        Senakot   Accutane    Cough:  Retin-A       Cough Drops  Tetracycline      Phenergan w/ Codeine if Rx  Minocycline      Robitussin (Plain &  DM)  Antibiotics:     Crabs/Lice:  Ceclor       RID  Cephalosporins    AVOID:  E-Mycins      Kwell  Keflex  Macrobid/Macrodantin   Diarrhea:  Penicillin      Kao-Pectate  Zithromax      Imodium AD         PUSH FLUIDS AVOID:       Cipro     Fever:  Tetracycline      Tylenol (Regular or Extra  Minocycline       Strength)  Levaquin      Extra Strength-Do not          Exceed 8 tabs/24 hrs Caffeine:        <261m/day (equiv. To 1 cup of coffee or  approx. 3 12 oz sodas)         Gas: Cold/Hayfever:       Gas-X  Benadryl      Mylicon  Claritin       Phazyme  **Claritin-D        Chlor-Trimeton    Headaches:  Dimetapp      ASA-Free Excedrin  Drixoral-Non-Drowsy     Cold Compress  Mucinex (Guaifenasin)     Tylenol (Regular or Extra  Sudafed/Sudafed-12 Hour     Strength)  **Sudafed PE Pseudoephedrine   Tylenol Cold & Sinus     Vicks Vapor Rub  Zyrtec  **AVOID if Problems With Blood Pressure         Heartburn: Avoid lying down for at least 1 hour after meals  Aciphex  Maalox     Rash:  Milk of Magnesia     Benadryl    Mylanta       1% Hydrocortisone Cream  Pepcid  Pepcid Complete   Sleep Aids:  Prevacid      Ambien   Prilosec       Benadryl  Rolaids       Chamomile Tea  Tums (Limit 4/day)     Unisom  Zantac       Tylenol PM         Warm milk-add vanilla or  Hemorrhoids:       Sugar for taste  Anusol/Anusol H.C.  (RX: Analapram 2.5%)  Sugar Substitutes:  Hydrocortisone OTC     Ok in moderation  Preparation H      Tucks        Vaseline lotion applied to tissue with wiping    Herpes:     Throat:  Acyclovir      Oragel  Famvir  Valtrex     Vaccines:         Flu Shot Leg Cramps:       *Gardasil  Benadryl      Hepatitis A         Hepatitis B Nasal Spray:       Pneumovax  Saline Nasal Spray     Polio Booster         Tetanus Nausea:       Tuberculosis test or PPD  Vitamin B6 25 mg TID   AVOID:    Dramamine      *Gardasil  Emetrol       Live  Poliovirus  Ginger Root 250 mg QID    MMR (measles, mumps &  High Complex Carbs @ Bedtime    rebella)  Sea Bands-Accupressure    Varicella (Chickenpox)  Unisom 1/2 tab TID     *No known complications           If received before Pain:         Known pregnancy;   Darvocet       Resume series after  Lortab        Delivery  Percocet    Yeast:   Tramadol      Femstat  Tylenol 3      Gyne-lotrimin  Ultram       Monistat  Vicodin           MISC:         All Sunscreens           Hair Coloring/highlights          Insect Repellant's          (Including DEET)         Mystic Tans

## 2020-04-30 ENCOUNTER — Other Ambulatory Visit: Payer: Self-pay

## 2020-04-30 ENCOUNTER — Encounter: Payer: BC Managed Care – PPO | Admitting: Obstetrics and Gynecology

## 2020-04-30 ENCOUNTER — Ambulatory Visit (INDEPENDENT_AMBULATORY_CARE_PROVIDER_SITE_OTHER): Payer: BC Managed Care – PPO

## 2020-04-30 DIAGNOSIS — F419 Anxiety disorder, unspecified: Secondary | ICD-10-CM | POA: Diagnosis not present

## 2020-04-30 DIAGNOSIS — N926 Irregular menstruation, unspecified: Secondary | ICD-10-CM | POA: Diagnosis not present

## 2020-04-30 DIAGNOSIS — O09521 Supervision of elderly multigravida, first trimester: Secondary | ICD-10-CM

## 2020-04-30 DIAGNOSIS — F329 Major depressive disorder, single episode, unspecified: Secondary | ICD-10-CM

## 2020-04-30 DIAGNOSIS — F32A Depression, unspecified: Secondary | ICD-10-CM

## 2020-04-30 DIAGNOSIS — Z3A09 9 weeks gestation of pregnancy: Secondary | ICD-10-CM

## 2020-05-01 ENCOUNTER — Ambulatory Visit (INDEPENDENT_AMBULATORY_CARE_PROVIDER_SITE_OTHER): Payer: BC Managed Care – PPO | Admitting: Surgical

## 2020-05-01 VITALS — BP 113/73 | HR 76 | Ht 67.0 in | Wt 234.9 lb

## 2020-05-01 DIAGNOSIS — Z3491 Encounter for supervision of normal pregnancy, unspecified, first trimester: Secondary | ICD-10-CM

## 2020-05-01 NOTE — Progress Notes (Signed)
Waylan Rocher presents for [place] interview visit. Pregnancy confirmation done 04/21/2020. G9. O1751. Pregnancy education material explained and given. 1 cats in home. NOB labs ordered. TSH/HbgA1c ordered due to BMI 30 or higher.  HIV labs and drug screen were explained and ordered. PNV encouraged. Genetic screening options discussed. Genetic testing: Unsure. Patient may discuss with the provider. Patient to follow up with provider in 2-3 weeks for NOB physical. All questions answered.

## 2020-05-02 LAB — HEPATITIS B SURFACE ANTIGEN: Hepatitis B Surface Ag: NEGATIVE

## 2020-05-02 LAB — URINALYSIS, ROUTINE W REFLEX MICROSCOPIC
Bilirubin, UA: NEGATIVE
Glucose, UA: NEGATIVE
Ketones, UA: NEGATIVE
Leukocytes,UA: NEGATIVE
Nitrite, UA: NEGATIVE
Protein,UA: NEGATIVE
RBC, UA: NEGATIVE
Specific Gravity, UA: 1.023 (ref 1.005–1.030)
Urobilinogen, Ur: 0.2 mg/dL (ref 0.2–1.0)
pH, UA: 7 (ref 5.0–7.5)

## 2020-05-02 LAB — HEMOGLOBIN A1C
Est. average glucose Bld gHb Est-mCnc: 91 mg/dL
Hgb A1c MFr Bld: 4.8 % (ref 4.8–5.6)

## 2020-05-02 LAB — VARICELLA ZOSTER ANTIBODY, IGG: Varicella zoster IgG: >4000 index (ref >165)

## 2020-05-02 LAB — ANTIBODY SCREEN: Antibody Screen: NEGATIVE

## 2020-05-02 LAB — ABO AND RH: Rh Factor: POSITIVE

## 2020-05-02 LAB — HIV ANTIBODY (ROUTINE TESTING W REFLEX): HIV Screen 4th Generation wRfx: NONREACTIVE

## 2020-05-02 LAB — TSH: TSH: 0.615 u[IU]/mL (ref 0.450–4.500)

## 2020-05-02 LAB — RPR: RPR Ser Ql: NONREACTIVE

## 2020-05-02 LAB — RUBELLA SCREEN: Rubella Antibodies, IGG: 3.67 index (ref >0.99)

## 2020-05-03 LAB — URINE CULTURE, OB REFLEX

## 2020-05-03 LAB — CULTURE, OB URINE

## 2020-05-04 LAB — MONITOR DRUG PROFILE 14(MW)
Amphetamine Scrn, Ur: NEGATIVE ng/mL
BARBITURATE SCREEN URINE: NEGATIVE ng/mL
BENZODIAZEPINE SCREEN, URINE: NEGATIVE ng/mL
Buprenorphine, Urine: NEGATIVE ng/mL
CANNABINOIDS UR QL SCN: NEGATIVE ng/mL
Cocaine (Metab) Scrn, Ur: NEGATIVE ng/mL
Creatinine(Crt), U: 136.1 mg/dL (ref 20.0–300.0)
Fentanyl, Urine: NEGATIVE pg/mL
Meperidine Screen, Urine: NEGATIVE ng/mL
Methadone Screen, Urine: NEGATIVE ng/mL
OXYCODONE+OXYMORPHONE UR QL SCN: NEGATIVE ng/mL
Opiate Scrn, Ur: NEGATIVE ng/mL
Ph of Urine: 6.7 (ref 4.5–8.9)
Phencyclidine Qn, Ur: NEGATIVE ng/mL
Propoxyphene Scrn, Ur: NEGATIVE ng/mL
SPECIFIC GRAVITY: 1.013
Tramadol Screen, Urine: NEGATIVE ng/mL

## 2020-05-04 LAB — NICOTINE SCREEN, URINE: Cotinine Ql Scrn, Ur: NEGATIVE ng/mL

## 2020-05-05 LAB — GC/CHLAMYDIA PROBE AMP
Chlamydia trachomatis, NAA: NEGATIVE
Neisseria Gonorrhoeae by PCR: NEGATIVE

## 2020-05-19 ENCOUNTER — Other Ambulatory Visit (HOSPITAL_COMMUNITY)
Admission: RE | Admit: 2020-05-19 | Discharge: 2020-05-19 | Disposition: A | Discharge status: Home / Self Care | Payer: BC Managed Care – PPO | Source: Ambulatory Visit | Attending: Obstetrics and Gynecology | Admitting: Obstetrics and Gynecology

## 2020-05-19 ENCOUNTER — Ambulatory Visit (INDEPENDENT_AMBULATORY_CARE_PROVIDER_SITE_OTHER): Payer: BC Managed Care – PPO | Admitting: Obstetrics and Gynecology

## 2020-05-19 ENCOUNTER — Encounter: Payer: Self-pay | Admitting: Obstetrics and Gynecology

## 2020-05-19 VITALS — BP 117/70 | HR 85 | Wt 232.7 lb

## 2020-05-19 DIAGNOSIS — F419 Anxiety disorder, unspecified: Secondary | ICD-10-CM

## 2020-05-19 DIAGNOSIS — Z3A12 12 weeks gestation of pregnancy: Secondary | ICD-10-CM

## 2020-05-19 DIAGNOSIS — Z124 Encounter for screening for malignant neoplasm of cervix: Secondary | ICD-10-CM | POA: Insufficient documentation

## 2020-05-19 DIAGNOSIS — O09299 Supervision of pregnancy with other poor reproductive or obstetric history, unspecified trimester: Secondary | ICD-10-CM

## 2020-05-19 DIAGNOSIS — O3412 Maternal care for benign tumor of corpus uteri, second trimester: Secondary | ICD-10-CM | POA: Insufficient documentation

## 2020-05-19 DIAGNOSIS — O3411 Maternal care for benign tumor of corpus uteri, first trimester: Secondary | ICD-10-CM

## 2020-05-19 DIAGNOSIS — O09529 Supervision of elderly multigravida, unspecified trimester: Secondary | ICD-10-CM

## 2020-05-19 DIAGNOSIS — O99210 Obesity complicating pregnancy, unspecified trimester: Secondary | ICD-10-CM

## 2020-05-19 DIAGNOSIS — F32A Depression, unspecified: Secondary | ICD-10-CM

## 2020-05-19 DIAGNOSIS — F329 Major depressive disorder, single episode, unspecified: Secondary | ICD-10-CM

## 2020-05-19 DIAGNOSIS — D259 Leiomyoma of uterus, unspecified: Secondary | ICD-10-CM

## 2020-05-19 LAB — POCT URINALYSIS DIPSTICK OB
Bilirubin, UA: NEGATIVE
Blood, UA: NEGATIVE
Glucose, UA: NEGATIVE
Ketones, UA: NEGATIVE
Leukocytes, UA: NEGATIVE
Nitrite, UA: NEGATIVE
Spec Grav, UA: >=1.03 — AB (ref 1.010–1.025)
Urobilinogen, UA: 0.2 E.U./dL
pH, UA: 6 (ref 5.0–8.0)

## 2020-05-19 MED ORDER — ASPIRIN EC 81 MG PO TBEC
81.0000 mg | DELAYED_RELEASE_TABLET | Freq: Every day | ORAL | 2 refills | Status: DC
Start: 2020-05-19 — End: 2020-11-28

## 2020-05-19 NOTE — Progress Notes (Signed)
NOB-PE PT present initial prenatal care. Pt stated that she was doing well no problems.  [place] completed today. PHQ-9=4. GAD-7=6.

## 2020-05-19 NOTE — Progress Notes (Signed)
OBSTETRIC INITIAL PRENATAL VISIT  Subjective:    Carmen Mathis is being seen today for her first obstetrical visit.  This is not a planned pregnancy. She is a 39 y.o. [id number] female at [redacted]w[redacted]d gestation, Estimated Date of Delivery: 12/01/20 with LMP recorded (lmp unknown), dated by 9 week sono. Her obstetrical history is significant for history of macrosomia, anxiety and depression, advanced maternal age, obesity and fibroid uterus. Relationship with FOB: spouse, living together (recently reconciled). Patient does intend to breast feed (breast pumping). Pregnancy history fully reviewed.    OB History  Gravida Para Term Preterm AB Living  9 5 5  0 3 5  SAB TAB Ectopic Multiple Live Births  3 0 0 0 5    # Outcome Date GA Lbr Len/2nd Weight Sex Delivery Anes PTL Lv  9 Current           8 SAB 2013          7 Term 07/05/10   9 lb 15 oz (4.508 kg) M Vag-Spont EPI  LIV  6 Term 01/12/08   9 lb 5 oz (4.224 kg) F Vag-Spont EPI  LIV  5 Term 02/25/06   9 lb 9 oz (4.338 kg) M Vag-Spont Spinal, EPI  LIV  4 Term 07/17/04   9 lb 9 oz (4.338 kg) F Vag-Spont EPI, Spinal  LIV  3 Term 09/02/02   8 lb 12 oz (3.969 kg) F Vag-Spont EPI, Spinal  LIV  2 SAB 2002          1 SAB 2002            Gynecologic History:  Last pap smear was ~ 10 years ago.  Results were normal.  Denies h/o abnormal pap smears in the past.  Denies history of STIs.  Contraception: None  Past Medical History:  Diagnosis Date  . Depression   . Dermatofibrosarcoma protuberans of lower extremity, left   . Skin cancer of nose     Family History  Problem Relation Age of Onset  . Diabetes Mother   . Hypertension Mother   . Depression Mother   . Melanoma Mother   . Stroke Father   . Depression Father   . Hypertension Father   . Melanoma Father   . Dementia Maternal Grandmother   . Depression Maternal Grandmother   . Melanoma Maternal Grandfather   . Heart disease Paternal Grandmother   . Anxiety disorder Sister   . Skin  cancer Brother   . Anxiety disorder Daughter   . Diabetes Son     Past Surgical History:  Procedure Laterality Date  . no surgical history      Social History   Socioeconomic History  . Marital status: Married    Spouse name: Not on file  . Number of children: Not on file  . Years of education: Not on file  . Highest education level: Not on file  Occupational History  . Not on file  Tobacco Use  . Smoking status: Never Smoker  . Smokeless tobacco: Never Used  Vaping Use  . Vaping Use: Never used  Substance and Sexual Activity  . Alcohol use: No  . Drug use: No  . Sexual activity: Yes    Birth control/protection: None  Other Topics Concern  . Not on file  Social History Narrative  . Not on file   Social Determinants of Health   Financial Resource Strain:   . Difficulty of Paying Living Expenses:   Food Insecurity:   .  Worried About Charity fundraiser in the Last Year:   . Arboriculturist in the Last Year:   Transportation Needs:   . Film/video editor (Medical):   Marland Kitchen Lack of Transportation (Non-Medical):   Physical Activity:   . Days of Exercise per Week:   . Minutes of Exercise per Session:   Stress:   . Feeling of Stress :   Social Connections:   . Frequency of Communication with Friends and Family:   . Frequency of Social Gatherings with Friends and Family:   . Attends Religious Services:   . Active Member of Clubs or Organizations:   . Attends Archivist Meetings:   Marland Kitchen Marital Status:   Intimate Partner Violence:   . Fear of Current or Ex-Partner:   . Emotionally Abused:   Marland Kitchen Physically Abused:   . Sexually Abused:     Current Outpatient Medications on File Prior to Visit  Medication Sig Dispense Refill  . acetaminophen (TYLENOL) 500 MG tablet Take 500 mg by mouth every 6 (six) hours as needed.    . ALPRAZolam (XANAX) 0.25 MG tablet Take 1 tablet (0.25 mg total) by mouth daily as needed for anxiety. 30 tablet 0  . citalopram (CELEXA)  40 MG tablet Take 1 tablet (40 mg total) by mouth daily. 90 tablet 3  . Multiple Vitamins-Minerals (WOMENS MULTI VITAMIN & MINERAL PO) Take by mouth.     No current facility-administered medications on file prior to visit.    Allergies  Allergen Reactions  . Ceclor [Cefaclor] Hives     Review of Systems General: Not Present- Fever, Weight Loss and Weight Gain. Skin: Not Present- Rash. HEENT: Not Present- Blurred Vision, Headache and Bleeding Gums. Respiratory: Not Present- Difficulty Breathing. Breast: Not Present- Breast Mass. Cardiovascular: Not Present- Chest Pain, Elevated Blood Pressure, Fainting / Blacking Out and Shortness of Breath. Gastrointestinal: Not Present- Abdominal Pain, Constipation, Nausea and Vomiting. Female Genitourinary: Not Present- Frequency, Painful Urination, Pelvic Pain, Vaginal Bleeding, Vaginal Discharge, Contractions, regular, Fetal Movements Decreased, Urinary Complaints and Vaginal Fluid. Musculoskeletal: Not Present- Leg Cramps.  Positive -Back Pain (notes moving a heavy table recently). Treating with ice packs and stretches.  Neurological: Not Present- Dizziness. Psychiatric: Present - Anxiety and Depression (controlled on medication).     Objective:   Blood pressure 117/70, pulse 85, weight 232 lb 11.2 oz (105.6 kg).  Body mass index is 36.45 kg/m.  General Appearance:    Alert, cooperative, no distress, appears stated age, moderate obesity  Head:    Normocephalic, without obvious abnormality, atraumatic  Eyes:    PERRL, conjunctiva/corneas clear, EOM's intact, both eyes  Ears:    Normal external ear canals, both ears  Nose:   Nares normal, septum midline, mucosa normal, no drainage or sinus tenderness  Throat:   Lips, mucosa, and tongue normal; teeth and gums normal  Neck:   Supple, symmetrical, trachea midline, no adenopathy; thyroid: no enlargement/tenderness/nodules; no carotid bruit or JVD  Back:     Symmetric, no curvature, ROM normal, no  CVA tenderness  Lungs:     Clear to auscultation bilaterally, respirations unlabored  Chest Wall:    No tenderness or deformity   Heart:    Regular rate and rhythm, S1 and S2 normal, no murmur, rub or gallop  Breast Exam:    No tenderness, masses, or nipple abnormality  Abdomen:     Soft, non-tender, bowel sounds active all four quadrants, no masses, no organomegaly.  FHT 154  bpm.  Genitalia:    Pelvic:external genitalia normal, vagina without lesions, discharge, or tenderness, rectovaginal septum  normal. Cervix normal in appearance, no cervical motion tenderness, no adnexal masses or tenderness.  Pregnancy positive findings: uterine enlargement: 12 wk size, nontender.   Rectal:    Normal external sphincter.  No hemorrhoids appreciated. Internal exam not done.   Extremities:   Extremities normal, atraumatic, no cyanosis or edema  Pulses:   2+ and symmetric all extremities  Skin:   Skin color, texture, turgor normal, no rashes or lesions  Lymph nodes:   Cervical, supraclavicular, and axillary nodes normal  Neurologic:   CNII-XII intact, normal strength, sensation and reflexes throughout      Assessment:   1. Supervision of high-risk pregnancy of elderly multigravida   2. [redacted] weeks gestation of pregnancy   3. Cervical cancer screening   4. Anxiety and depression   5. History of macrosomia in infant in prior pregnancy, currently pregnant   6. Uterine fibroids affecting pregnancy in first trimester   7. Obesity in pregnancy     Plan:   1. Supervision of high-risk pregnancy of elderly multigravada  -  Initial labs reviewed. - Pap smear performed today.  - Prenatal vitamins encouraged. - Problem list reviewed and updated. - New OB counseling:  The patient has been given an overview regarding routine prenatal care.   - Prenatal testing, optional genetic testing, and ultrasound use in pregnancy were reviewed.  Cell-free DNA testing desired: ordered. - Benefits of Breast Feeding were  discussed. The patient is encouraged to consider nursing her baby post partum. - Advised on initiation of daily baby aspirin.   2. Anxiety and depression - Previously on Celexa and Xanax prior to pregnancy. Has continued the Celexa, holding Xanax. Baseline PHQ-9 score is 4, GAD score is 6.  If anxiety worsens during the pregnancy, can initiate Vistaril.   3. History of macrosomia in infant in prior pregnancy, currently pregnant - Patient with h/o macrosomia x 4.  No prior h/o GDM in pregnancy. Recent HgbA1c is 4.8.  Can perform glucola in third trimester.   4. Uterine fibroids affecting pregnancy in first trimester - Newly diagnosed fibroid on dating scan, 6 cm, posterior location.  Will continue to monitor during the pregnancy, will need serial growth scans after 28 weeks.   5. Obesity in pregnancy - Recommendations regarding diet, weight gain, and exercise in pregnancy were given.  Follow up in 4 weeks.   Rubie Maid, MD [facility]

## 2020-05-19 NOTE — Patient Instructions (Signed)
Second Trimester of Pregnancy  The second trimester is from week 14 through week 27 (month 4 through 6). This is often the time in pregnancy that you feel your best. Often times, morning sickness has lessened or quit. You may have more energy, and you may get hungry more often. Your unborn baby is growing rapidly. At the end of the sixth month, he or she is about 9 inches long and weighs about 1 pounds. You will likely feel the baby move between 18 and 20 weeks of pregnancy. Follow these instructions at home: Medicines  Take over-the-counter and prescription medicines only as told by your doctor. Some medicines are safe and some medicines are not safe during pregnancy.  Take a prenatal vitamin that contains at least 600 micrograms (mcg) of folic acid.  If you have trouble pooping (constipation), take medicine that will make your stool soft (stool softener) if your doctor approves. Eating and drinking   Eat regular, healthy meals.  Avoid raw meat and uncooked cheese.  If you get low calcium from the food you eat, talk to your doctor about taking a daily calcium supplement.  Avoid foods that are high in fat and sugars, such as fried and sweet foods.  If you feel sick to your stomach (nauseous) or throw up (vomit): ? Eat 4 or 5 small meals a day instead of 3 large meals. ? Try eating a few soda crackers. ? Drink liquids between meals instead of during meals.  To prevent constipation: ? Eat foods that are high in fiber, like fresh fruits and vegetables, whole grains, and beans. ? Drink enough fluids to keep your pee (urine) clear or pale yellow. Activity  Exercise only as told by your doctor. Stop exercising if you start to have cramps.  Do not exercise if it is too hot, too humid, or if you are in a place of great height (high altitude).  Avoid heavy lifting.  Wear low-heeled shoes. Sit and stand up straight.  You can continue to have sex unless your doctor tells you not  to. Relieving pain and discomfort  Wear a good support bra if your breasts are tender.  Take warm water baths (sitz baths) to soothe pain or discomfort caused by hemorrhoids. Use hemorrhoid cream if your doctor approves.  Rest with your legs raised if you have leg cramps or low back pain.  If you develop puffy, bulging veins (varicose veins) in your legs: ? Wear support hose or compression stockings as told by your doctor. ? Raise (elevate) your feet for 15 minutes, 3-4 times a day. ? Limit salt in your food. Prenatal care  Write down your questions. Take them to your prenatal visits.  Keep all your prenatal visits as told by your doctor. This is important. Safety  Wear your seat belt when driving.  Make a list of emergency phone numbers, including numbers for family, friends, the hospital, and police and fire departments. General instructions  Ask your doctor about the right foods to eat or for help finding a counselor, if you need these services.  Ask your doctor about local prenatal classes. Begin classes before month 6 of your pregnancy.  Do not use hot tubs, steam rooms, or saunas.  Do not douche or use tampons or scented sanitary pads.  Do not cross your legs for long periods of time.  Visit your dentist if you have not done so. Use a soft toothbrush to brush your teeth. Floss gently.  Avoid all smoking, herbs,   and alcohol. Avoid drugs that are not approved by your doctor.  Do not use any products that contain nicotine or tobacco, such as cigarettes and e-cigarettes. If you need help quitting, ask your doctor.  Avoid cat litter boxes and soil used by cats. These carry germs that can cause birth defects in the baby and can cause a loss of your baby (miscarriage) or stillbirth. Contact a doctor if:  You have mild cramps or pressure in your lower belly.  You have pain when you pee (urinate).  You have bad smelling fluid coming from your vagina.  You continue to  feel sick to your stomach (nauseous), throw up (vomit), or have watery poop (diarrhea).  You have a nagging pain in your belly area.  You feel dizzy. Get help right away if:  You have a fever.  You are leaking fluid from your vagina.  You have spotting or bleeding from your vagina.  You have severe belly cramping or pain.  You lose or gain weight rapidly.  You have trouble catching your breath and have chest pain.  You notice sudden or extreme puffiness (swelling) of your face, hands, ankles, feet, or legs.  You have not felt the baby move in over an hour.  You have severe headaches that do not go away when you take medicine.  You have trouble seeing. Summary  The second trimester is from week 14 through week 27 (months 4 through 6). This is often the time in pregnancy that you feel your best.  To take care of yourself and your unborn baby, you will need to eat healthy meals, take medicines only if your doctor tells you to do so, and do activities that are safe for you and your baby.  Call your doctor if you get sick or if you notice anything unusual about your pregnancy. Also, call your doctor if you need help with the right food to eat, or if you want to know what activities are safe for you. This information is not intended to replace advice given to you by your health care provider. Make sure you discuss any questions you have with your health care provider. Document Revised: 01/25/2019 Document Reviewed: 11/08/2016 Elsevier Patient Education  2020 [employer]. Commonly Asked Questions During Pregnancy  Cats: A parasite can be excreted in cat feces.  To avoid exposure you need to have another person empty the little box.  If you must empty the litter box you will need to wear gloves.  Wash your hands after handling your cat.  This parasite can also be found in raw or undercooked meat so this should also be avoided.  Colds, Sore Throats, Flu: Please check your medication  sheet to see what you can take for symptoms.  If your symptoms are unrelieved by these medications please call the office.  Dental Work: Most any dental work Investment banker, corporate recommends is permitted.  X-rays should only be taken during the first trimester if absolutely necessary.  Your abdomen should be shielded with a lead apron during all x-rays.  Please notify your provider prior to receiving any x-rays.  Novocaine is fine; gas is not recommended.  If your dentist requires a note from [place] prior to dental work please call the office and we will provide one for you.  Exercise: Exercise is an important part of staying healthy during your pregnancy.  You may continue most exercises you were accustomed to prior to pregnancy.  Later in your pregnancy you will most  likely notice you have difficulty with activities requiring balance like riding a bicycle.  It is important that you listen to your body and avoid activities that put you at a higher risk of falling.  Adequate rest and staying well hydrated are a must!  If you have questions about the safety of specific activities ask your provider.    Exposure to Children with illness: Try to avoid obvious exposure; report any symptoms to [place] when noted,  If you have chicken pos, red measles or mumps, you should be immune to these diseases.   Please do not take any vaccines while pregnant unless you have checked with your OB provider.  Fetal Movement: After 28 weeks we recommend you do "kick counts" twice daily.  Lie or sit down in a calm quiet environment and count your baby movements "kicks".  You should feel your baby at least 10 times per hour.  If you have not felt 10 kicks within the first hour get up, walk around and have something sweet to eat or drink then repeat for an additional hour.  If count remains less than 10 per hour notify your provider.  Fumigating: Follow your pest control agent's advice as to how long to stay out of your home.  Ventilate the area  well before re-entering.  Hemorrhoids:   Most over-the-counter preparations can be used during pregnancy.  Check your medication to see what is safe to use.  It is important to use a stool softener or fiber in your diet and to drink lots of liquids.  If hemorrhoids seem to be getting worse please call the office.   Hot Tubs:  Hot tubs Jacuzzis and saunas are not recommended while pregnant.  These increase your internal body temperature and should be avoided.  Intercourse:  Sexual intercourse is safe during pregnancy as long as you are comfortable, unless otherwise advised by your provider.  Spotting may occur after intercourse; report any bright red bleeding that is heavier than spotting.  Labor:  If you know that you are in labor, please go to the hospital.  If you are unsure, please call the office and let us help you decide what to do.  Lifting, straining, etc:  If your job requires heavy lifting or straining please check with your provider for any limitations.  Generally, you should not lift items heavier than that you can lift simply with your hands and arms (no back muscles)  Painting:  Paint fumes do not harm your pregnancy, but may make you ill and should be avoided if possible.  Latex or water based paints have less odor than oils.  Use adequate ventilation while painting.  Permanents & Hair Color:  Chemicals in hair dyes are not recommended as they cause increase hair dryness which can increase hair loss during pregnancy.  " Highlighting" and permanents are allowed.  Dye may be absorbed differently and permanents may not hold as well during pregnancy.  Sunbathing:  Use a sunscreen, as skin burns easily during pregnancy.  Drink plenty of fluids; avoid over heating.  Tanning Beds:  Because their possible side effects are still unknown, tanning beds are not recommended.  Ultrasound Scans:  Routine ultrasounds are performed at approximately 20 weeks.  You will be able to see your baby's  general anatomy an if you would like to know the gender this can usually be determined as well.  If it is questionable when you conceived you may also receive an ultrasound early in your pregnancy  for dating purposes.  Otherwise ultrasound exams are not routinely performed unless there is a medical necessity.  Although you can request a scan we ask that you pay for it when conducted because insurance does not cover " patient request" scans.  Work: If your pregnancy proceeds without complications you may work until your due date, unless your physician or employer advises otherwise.  Round Ligament Pain/Pelvic Discomfort:  Sharp, shooting pains not associated with bleeding are fairly common, usually occurring in the second trimester of pregnancy.  They tend to be worse when standing up or when you remain standing for long periods of time.  These are the result of pressure of certain pelvic ligaments called "round ligaments".  Rest, Tylenol and heat seem to be the most effective relief.  As the womb and fetus grow, they rise out of the pelvis and the discomfort improves.  Please notify the office if your pain seems different than that described.  It may represent a more serious condition.  Common Medications Safe in Pregnancy  Acne:      Constipation:  Benzoyl Peroxide     Colace  Clindamycin      Dulcolax Suppository  Topica Erythromycin     Fibercon  Salicylic Acid      Metamucil         Miralax AVOID:        Senakot   Accutane    Cough:  Retin-A       Cough Drops  Tetracycline      Phenergan w/ Codeine if Rx  Minocycline      Robitussin (Plain & DM)  Antibiotics:     Crabs/Lice:  Ceclor       RID  Cephalosporins    AVOID:  E-Mycins      Kwell  Keflex  Macrobid/Macrodantin   Diarrhea:  Penicillin      Kao-Pectate  Zithromax      Imodium AD         PUSH FLUIDS AVOID:       Cipro     Fever:  Tetracycline      Tylenol (Regular or Extra  Minocycline       Strength)  Levaquin      Extra  Strength-Do not          Exceed 8 tabs/24 hrs Caffeine:        '200mg'$ /day (equiv. To 1 cup of coffee or  approx. 3 12 oz sodas)         Gas: Cold/Hayfever:       Gas-X  Benadryl      Mylicon  Claritin       Phazyme  **Claritin-D        Chlor-Trimeton    Headaches:  Dimetapp      ASA-Free Excedrin  Drixoral-Non-Drowsy     Cold Compress  Mucinex (Guaifenasin)     Tylenol (Regular or Extra  Sudafed/Sudafed-12 Hour     Strength)  **Sudafed PE Pseudoephedrine   Tylenol Cold & Sinus     Vicks Vapor Rub  Zyrtec  **AVOID if Problems With Blood Pressure         Heartburn: Avoid lying down for at least 1 hour after meals  Aciphex      Maalox     Rash:  Milk of Magnesia     Benadryl    Mylanta       1% Hydrocortisone Cream  Pepcid  Pepcid Complete   Sleep Aids:  Prevacid  Ambien   Prilosec       Benadryl  Rolaids       Chamomile Tea  Tums (Limit 4/day)     Unisom         Tylenol PM         Warm milk-add vanilla or  Hemorrhoids:       Sugar for taste  Anusol/Anusol H.C.  (RX: Analapram 2.5%)  Sugar Substitutes:  Hydrocortisone OTC     Ok in moderation  Preparation H      Tucks        Vaseline lotion applied to tissue with wiping    Herpes:     Throat:  Acyclovir      Oragel  Famvir  Valtrex     Vaccines:         Flu Shot Leg Cramps:       *Gardasil  Benadryl      Hepatitis A         Hepatitis B Nasal Spray:       Pneumovax  Saline Nasal Spray     Polio Booster         Tetanus Nausea:       Tuberculosis test or PPD  Vitamin B6 25 mg TID   AVOID:    Dramamine      *Gardasil  Emetrol       Live Poliovirus  Ginger Root 250 mg QID    MMR (measles, mumps &  High Complex Carbs @ Bedtime    rebella)  Sea Bands-Accupressure    Varicella (Chickenpox)  Unisom 1/2 tab TID     *No known complications           If received before Pain:         Known pregnancy;   Darvocet       Resume series  after  Lortab        Delivery  Percocet    Yeast:   Tramadol      Femstat  Tylenol 3      Gyne-lotrimin  Ultram       Monistat  Vicodin           MISC:         All Sunscreens           Hair Coloring/highlights          Insect Repellant's          (Including DEET)         Mystic Tans

## 2020-05-22 LAB — CYTOLOGY - PAP
Comment: NEGATIVE
Diagnosis: NEGATIVE
High risk HPV: NEGATIVE

## 2020-06-17 ENCOUNTER — Encounter: Payer: BC Managed Care – PPO | Admitting: Obstetrics and Gynecology

## 2020-06-25 ENCOUNTER — Encounter: Payer: Self-pay | Admitting: Obstetrics and Gynecology

## 2020-06-29 ENCOUNTER — Other Ambulatory Visit: Payer: Self-pay | Admitting: Internal Medicine

## 2020-07-29 ENCOUNTER — Encounter: Payer: BC Managed Care – PPO | Admitting: Obstetrics and Gynecology

## 2020-08-11 ENCOUNTER — Encounter: Payer: BC Managed Care – PPO | Admitting: Obstetrics and Gynecology

## 2020-08-12 ENCOUNTER — Ambulatory Visit (INDEPENDENT_AMBULATORY_CARE_PROVIDER_SITE_OTHER): Payer: BC Managed Care – PPO | Admitting: Obstetrics and Gynecology

## 2020-08-12 ENCOUNTER — Encounter: Payer: Self-pay | Admitting: Obstetrics and Gynecology

## 2020-08-12 ENCOUNTER — Other Ambulatory Visit: Payer: Self-pay

## 2020-08-12 VITALS — BP 107/69 | HR 69 | Wt 240.4 lb

## 2020-08-12 DIAGNOSIS — Z3A24 24 weeks gestation of pregnancy: Secondary | ICD-10-CM

## 2020-08-12 DIAGNOSIS — O09529 Supervision of elderly multigravida, unspecified trimester: Secondary | ICD-10-CM

## 2020-08-12 DIAGNOSIS — O3412 Maternal care for benign tumor of corpus uteri, second trimester: Secondary | ICD-10-CM

## 2020-08-12 DIAGNOSIS — D259 Leiomyoma of uterus, unspecified: Secondary | ICD-10-CM

## 2020-08-12 LAB — POCT URINALYSIS DIPSTICK OB
Bilirubin, UA: NEGATIVE
Blood, UA: NEGATIVE
Glucose, UA: NEGATIVE
Ketones, UA: NEGATIVE
Leukocytes, UA: NEGATIVE
Nitrite, UA: NEGATIVE
POC,PROTEIN,UA: NEGATIVE
Spec Grav, UA: 1.015 (ref 1.010–1.025)
Urobilinogen, UA: 0.2 E.U./dL
pH, UA: 8 (ref 5.0–8.0)

## 2020-08-12 NOTE — Progress Notes (Signed)
ROB: Missed multiple appointments for a family issue.  FAS rescheduled, will do growth at the same time because of the fibroid.  Follow-up in 2 weeks for 1 hour GCT.  Patient currently reports daily fetal movement.  Patient describes a history of abdominal pain and vomiting which seem likely consistent with cholelithiasis.  It has not been recurrent.  Low-fat diet specifically discussed.

## 2020-08-17 ENCOUNTER — Ambulatory Visit (INDEPENDENT_AMBULATORY_CARE_PROVIDER_SITE_OTHER): Payer: BC Managed Care – PPO

## 2020-08-17 ENCOUNTER — Other Ambulatory Visit: Payer: Self-pay

## 2020-08-17 DIAGNOSIS — D259 Leiomyoma of uterus, unspecified: Secondary | ICD-10-CM

## 2020-08-17 DIAGNOSIS — O09529 Supervision of elderly multigravida, unspecified trimester: Secondary | ICD-10-CM | POA: Diagnosis not present

## 2020-08-17 DIAGNOSIS — Z3A24 24 weeks gestation of pregnancy: Secondary | ICD-10-CM

## 2020-08-17 DIAGNOSIS — O3412 Maternal care for benign tumor of corpus uteri, second trimester: Secondary | ICD-10-CM | POA: Diagnosis not present

## 2020-08-27 ENCOUNTER — Encounter: Payer: BC Managed Care – PPO | Admitting: Internal Medicine

## 2020-08-27 NOTE — Progress Notes (Deleted)
Subjective:    Patient ID: Carmen Mathis, female    DOB: [birth year], 39 y.o.   MRN: [id number]  HPI  Pt presents to the clinic today for her annual exam.  Anxiety and Depression:  Flu: Tetanus: Covid: Pap Smear: Vision Screening: Dentist:  Diet: Exercise:  Review of Systems      Past Medical History:  Diagnosis Date  . Depression   . Dermatofibrosarcoma protuberans of lower extremity, left   . Skin cancer of nose     Current Outpatient Medications  Medication Sig Dispense Refill  . acetaminophen (TYLENOL) 500 MG tablet Take 500 mg by mouth every 6 (six) hours as needed.    . ALPRAZolam (XANAX) 0.25 MG tablet Take 1 tablet (0.25 mg total) by mouth daily as needed for anxiety. 30 tablet 0  . aspirin EC 81 MG tablet Take 1 tablet (81 mg total) by mouth daily. Take after 12 weeks for prevention of preeclampssia later in pregnancy 300 tablet 2  . citalopram (CELEXA) 40 MG tablet Take 1 tablet by mouth once daily 90 tablet 0  . Multiple Vitamins-Minerals (WOMENS MULTI VITAMIN & MINERAL PO) Take by mouth.     No current facility-administered medications for this visit.    Allergies  Allergen Reactions  . Ceclor [Cefaclor] Hives    Family History  Problem Relation Age of Onset  . Diabetes Mother   . Hypertension Mother   . Depression Mother   . Melanoma Mother   . Stroke Father   . Depression Father   . Hypertension Father   . Melanoma Father   . Dementia Maternal Grandmother   . Depression Maternal Grandmother   . Melanoma Maternal Grandfather   . Heart disease Paternal Grandmother   . Anxiety disorder Sister   . Skin cancer Brother   . Anxiety disorder Daughter   . Diabetes Son     Social History   Socioeconomic History  . Marital status: Married    Spouse name: Not on file  . Number of children: Not on file  . Years of education: Not on file  . Highest education level: Not on file  Occupational History  . Not on file  Tobacco Use  . Smoking  status: Never Smoker  . Smokeless tobacco: Never Used  Vaping Use  . Vaping Use: Never used  Substance and Sexual Activity  . Alcohol use: No  . Drug use: No  . Sexual activity: Yes    Birth control/protection: None  Other Topics Concern  . Not on file  Social History Narrative  . Not on file   Social Determinants of Health   Financial Resource Strain:   . Difficulty of Paying Living Expenses: Not on file  Food Insecurity:   . Worried About Charity fundraiser in the Last Year: Not on file  . Ran Out of Food in the Last Year: Not on file  Transportation Needs:   . Lack of Transportation (Medical): Not on file  . Lack of Transportation (Non-Medical): Not on file  Physical Activity:   . Days of Exercise per Week: Not on file  . Minutes of Exercise per Session: Not on file  Stress:   . Feeling of Stress : Not on file  Social Connections:   . Frequency of Communication with Friends and Family: Not on file  . Frequency of Social Gatherings with Friends and Family: Not on file  . Attends Religious Services: Not on file  . Active Member of Clubs or  Organizations: Not on file  . Attends Archivist Meetings: Not on file  . Marital Status: Not on file  Intimate Partner Violence:   . Fear of Current or Ex-Partner: Not on file  . Emotionally Abused: Not on file  . Physically Abused: Not on file  . Sexually Abused: Not on file     Constitutional: Denies fever, malaise, fatigue, headache or abrupt weight changes.  HEENT: Denies eye pain, eye redness, ear pain, ringing in the ears, wax buildup, runny nose, nasal congestion, bloody nose, or sore throat. Respiratory: Denies difficulty breathing, shortness of breath, cough or sputum production.   Cardiovascular: Denies chest pain, chest tightness, palpitations or swelling in the hands or feet.  Gastrointestinal: Denies abdominal pain, bloating, constipation, diarrhea or blood in the stool.  GU: Denies urgency, frequency, pain  with urination, burning sensation, blood in urine, odor or discharge. Musculoskeletal: Denies decrease in range of motion, difficulty with gait, muscle pain or joint pain and swelling.  Skin: Denies redness, rashes, lesions or ulcercations.  Neurological: Denies dizziness, difficulty with memory, difficulty with speech or problems with balance and coordination.  Psych: Denies anxiety, depression, SI/HI.  No other specific complaints in a complete review of systems (except as listed in HPI above).  Objective:   Physical Exam    LMP  (LMP Unknown)  Wt Readings from Last 3 Encounters:  08/12/20 240 lb 6.4 oz (109 kg)  05/19/20 232 lb 11.2 oz (105.6 kg)  05/01/20 234 lb 14.4 oz (106.5 kg)    General: Appears their stated age, well developed, well nourished in NAD. Skin: Warm, dry and intact. No rashes, lesions or ulcerations noted. HEENT: Head: normal shape and size; Eyes: sclera white, no icterus, conjunctiva pink, PERRLA and EOMs intact; Ears: Tm's gray and intact, normal light reflex; Nose: mucosa pink and moist, septum midline; Throat/Mouth: Teeth present, mucosa pink and moist, no exudate, lesions or ulcerations noted.  Neck:  Neck supple, trachea midline. No masses, lumps or thyromegaly present.  Cardiovascular: Normal rate and rhythm. S1,S2 noted.  No murmur, rubs or gallops noted. No JVD or BLE edema. No carotid bruits noted. Pulmonary/Chest: Normal effort and positive vesicular breath sounds. No respiratory distress. No wheezes, rales or ronchi noted.  Abdomen: Soft and nontender. Normal bowel sounds. No distention or masses noted. Liver, spleen and kidneys non palpable. Musculoskeletal: Normal range of motion. No signs of joint swelling. No difficulty with gait.  Neurological: Alert and oriented. Cranial nerves II-XII grossly intact. Coordination normal.  Psychiatric: Mood and affect normal. Behavior is normal. Judgment and thought content normal.   EKG:  BMET No results found  for: NA, K, CL, CO2, GLUCOSE, BUN, CREATININE, CALCIUM, GFRNONAA, GFRAA  Lipid Panel  No results found for: CHOL, TRIG, HDL, CHOLHDL, VLDL, LDLCALC  CBC No results found for: WBC, RBC, HGB, HCT, PLT, MCV, MCH, MCHC, RDW, LYMPHSABS, MONOABS, EOSABS, BASOSABS  Hgb A1C Lab Results  Component Value Date   HGBA1C 4.8 05/01/2020          Assessment & Plan:    Webb Silversmith, NP This visit occurred during the SARS-CoV-2 public health emergency.  Safety protocols were in place, including screening questions prior to the visit, additional usage of staff PPE, and extensive cleaning of exam room while observing appropriate contact time as indicated for disinfecting solutions.

## 2020-08-31 NOTE — Progress Notes (Signed)
ROB-Pt present for routine prenatal care, 28 week labs, BTC. Flu vaccine and Tdap.  Pt stated having rash/bumps on lips.

## 2020-08-31 NOTE — Patient Instructions (Addendum)
WHAT OB PATIENTS CAN EXPECT   Confirmation of pregnancy and ultrasound ordered if medically indicated-[redacted] weeks gestation  New OB (NOB) intake with nurse and New OB (NOB) labs- [redacted] weeks gestation  New OB (NOB) physical examination with provider- 11/[redacted] weeks gestation  Flu vaccine-[redacted] weeks gestation  Anatomy scan-[redacted] weeks gestation  Glucose tolerance test, blood work to test for anemia, T-dap vaccine-[redacted] weeks gestation  Vaginal swabs/cultures-STD/Group B strep-[redacted] weeks gestation  Appointments every 4 weeks until 28 weeks  Every 2 weeks from 28 weeks until 36 weeks  Weekly visits from 36 weeks until delivery  Third Trimester of Pregnancy  The third trimester is from week 28 through week 40 (months 7 through 9). This trimester is when your unborn baby (fetus) is growing very fast. At the end of the ninth month, the unborn baby is about 20 inches in length. It weighs about 6-10 pounds. Follow these instructions at home: Medicines  Take over-the-counter and prescription medicines only as told by your doctor. Some medicines are safe and some medicines are not safe during pregnancy.  Take a prenatal vitamin that contains at least 600 micrograms (mcg) of folic acid.  If you have trouble pooping (constipation), take medicine that will make your stool soft (stool softener) if your doctor approves. Eating and drinking   Eat regular, healthy meals.  Avoid raw meat and uncooked cheese.  If you get low calcium from the food you eat, talk to your doctor about taking a daily calcium supplement.  Eat four or five small meals rather than three large meals a day.  Avoid foods that are high in fat and sugars, such as fried and sweet foods.  To prevent constipation: ? Eat foods that are high in fiber, like fresh fruits and vegetables, whole grains, and beans. ? Drink enough fluids to keep your pee (urine) clear or pale yellow. Activity  Exercise only as told by your doctor. Stop  exercising if you start to have cramps.  Avoid heavy lifting, wear low heels, and sit up straight.  Do not exercise if it is too hot, too humid, or if you are in a place of great height (high altitude).  You may continue to have sex unless your doctor tells you not to. Relieving pain and discomfort  Wear a good support bra if your breasts are tender.  Take frequent breaks and rest with your legs raised if you have leg cramps or low back pain.  Take warm water baths (sitz baths) to soothe pain or discomfort caused by hemorrhoids. Use hemorrhoid cream if your doctor approves.  If you develop puffy, bulging veins (varicose veins) in your legs: ? Wear support hose or compression stockings as told by your doctor. ? Raise (elevate) your feet for 15 minutes, 3-4 times a day. ? Limit salt in your food. Safety  Wear your seat belt when driving.  Make a list of emergency phone numbers, including numbers for family, friends, the hospital, and police and fire departments. Preparing for your baby's arrival To prepare for the arrival of your baby:  Take prenatal classes.  Practice driving to the hospital.  Visit the hospital and tour the maternity area.  Talk to your work about taking leave once the baby comes.  Pack your hospital bag.  Prepare the baby's room.  Go to your doctor visits.  Buy a rear-facing car seat. Learn how to install it in your car. General instructions  Do not use hot tubs, steam rooms, or saunas.  Do not use any products that contain nicotine or tobacco, such as cigarettes and e-cigarettes. If you need help quitting, ask your doctor.  Do not drink alcohol.  Do not douche or use tampons or scented sanitary pads.  Do not cross your legs for long periods of time.  Do not travel for long distances unless you must. Only do so if your doctor says it is okay.  Visit your dentist if you have not gone during your pregnancy. Use a soft toothbrush to brush your  teeth. Be gentle when you floss.  Avoid cat litter boxes and soil used by cats. These carry germs that can cause birth defects in the baby and can cause a loss of your baby (miscarriage) or stillbirth.  Keep all your prenatal visits as told by your doctor. This is important. Contact a doctor if:  You are not sure if you are in labor or if your water has broken.  You are dizzy.  You have mild cramps or pressure in your lower belly.  You have a nagging pain in your belly area.  You continue to feel sick to your stomach, you throw up, or you have watery poop.  You have bad smelling fluid coming from your vagina.  You have pain when you pee. Get help right away if:  You have a fever.  You are leaking fluid from your vagina.  You are spotting or bleeding from your vagina.  You have severe belly cramps or pain.  You lose or gain weight quickly.  You have trouble catching your breath and have chest pain.  You notice sudden or extreme puffiness (swelling) of your face, hands, ankles, feet, or legs.  You have not felt the baby move in over an hour.  You have severe headaches that do not go away with medicine.  You have trouble seeing.  You are leaking, or you are having a gush of fluid, from your vagina before you are 37 weeks.  You have regular belly spasms (contractions) before you are 37 weeks. Summary  The third trimester is from week 28 through week 40 (months 7 through 9). This time is when your unborn baby is growing very fast.  Follow your doctor's advice about medicine, food, and activity.  Get ready for the arrival of your baby by taking prenatal classes, getting all the baby items ready, preparing the baby's room, and visiting your doctor to be checked.  Get help right away if you are bleeding from your vagina, or you have chest pain and trouble catching your breath, or if you have not felt your baby move in over an hour. This information is not intended to  replace advice given to you by your health care provider. Make sure you discuss any questions you have with your health care provider. Document Revised: 01/24/2019 Document Reviewed: 11/08/2016 Elsevier Patient Education  [place]. Common Medications Safe in Pregnancy  Acne:      Constipation:  Benzoyl Peroxide     Colace  Clindamycin      Dulcolax Suppository  Topica Erythromycin     Fibercon  Salicylic Acid      Metamucil         Miralax AVOID:        Senakot   Accutane    Cough:  Retin-A       Cough Drops  Tetracycline      Phenergan w/ Codeine if Rx  Minocycline      Robitussin (Plain &  DM)  Antibiotics:     Crabs/Lice:  Ceclor       RID  Cephalosporins    AVOID:  E-Mycins      Kwell  Keflex  Macrobid/Macrodantin   Diarrhea:  Penicillin      Kao-Pectate  Zithromax      Imodium AD         PUSH FLUIDS AVOID:       Cipro     Fever:  Tetracycline      Tylenol (Regular or Extra  Minocycline       Strength)  Levaquin      Extra Strength-Do not          Exceed 8 tabs/24 hrs Caffeine:        <250m/day (equiv. To 1 cup of coffee or  approx. 3 12 oz sodas)         Gas: Cold/Hayfever:       Gas-X  Benadryl      Mylicon  Claritin       Phazyme  **Claritin-D        Chlor-Trimeton    Headaches:  Dimetapp      ASA-Free Excedrin  Drixoral-Non-Drowsy     Cold Compress  Mucinex (Guaifenasin)     Tylenol (Regular or Extra  Sudafed/Sudafed-12 Hour     Strength)  **Sudafed PE Pseudoephedrine   Tylenol Cold & Sinus     Vicks Vapor Rub  Zyrtec  **AVOID if Problems With Blood Pressure         Heartburn: Avoid lying down for at least 1 hour after meals  Aciphex      Maalox     Rash:  Milk of Magnesia     Benadryl    Mylanta       1% Hydrocortisone Cream  Pepcid  Pepcid Complete   Sleep Aids:  Prevacid      Ambien   Prilosec       Benadryl  Rolaids       Chamomile Tea  Tums (Limit 4/day)     Unisom         Tylenol PM         Warm milk-add vanilla  or  Hemorrhoids:       Sugar for taste  Anusol/Anusol H.C.  (RX: Analapram 2.5%)  Sugar Substitutes:  Hydrocortisone OTC     Ok in moderation  Preparation H      Tucks        Vaseline lotion applied to tissue with wiping    Herpes:     Throat:  Acyclovir      Oragel  Famvir  Valtrex     Vaccines:         Flu Shot Leg Cramps:       *Gardasil  Benadryl      Hepatitis A         Hepatitis B Nasal Spray:       Pneumovax  Saline Nasal Spray     Polio Booster         Tetanus Nausea:       Tuberculosis test or PPD  Vitamin B6 25 mg TID   AVOID:    Dramamine      *Gardasil  Emetrol       Live Poliovirus  Ginger Root 250 mg QID    MMR (measles, mumps &  High Complex Carbs @ Bedtime    rebella)  Sea Bands-Accupressure    Varicella (Chickenpox)  Unisom 1/2 tab TID     *  No known complications           If received before Pain:         Known pregnancy;   Darvocet       Resume series after  Lortab        Delivery  Percocet    Yeast:   Tramadol      Femstat  Tylenol 3      Gyne-lotrimin  Ultram       Monistat  Vicodin           MISC:         All Sunscreens           Hair Coloring/highlights          Insect Repellant's          (Including DEET)         Mystic Tans https://www.cdc.gov/vaccines/hcp/vis/vis-statements/tdap.pdf">  Tdap (Tetanus, Diphtheria, Pertussis) Vaccine: What You Need to Know 1. Why get vaccinated? Tdap vaccine can prevent tetanus, diphtheria, and pertussis. Diphtheria and pertussis spread from person to person. Tetanus enters the body through cuts or wounds.  TETANUS (T) causes painful stiffening of the muscles. Tetanus can lead to serious health problems, including being unable to open the mouth, having trouble swallowing and breathing, or death.  DIPHTHERIA (D) can lead to difficulty breathing, heart failure, paralysis, or death.  PERTUSSIS (aP), also known as "whooping cough," can cause uncontrollable, violent coughing which makes it hard to breathe, eat, or  drink. Pertussis can be extremely serious in babies and young children, causing pneumonia, convulsions, brain damage, or death. In teens and adults, it can cause weight loss, loss of bladder control, passing out, and rib fractures from severe coughing. 2. Tdap vaccine Tdap is only for children 7 years and older, adolescents, and adults.  Adolescents should receive a single dose of Tdap, preferably at age 51 or 64 years. Pregnant women should get a dose of Tdap during every pregnancy, to protect the newborn from pertussis. Infants are most at risk for severe, life-threatening complications from pertussis. Adults who have never received Tdap should get a dose of Tdap. Also, adults should receive a booster dose every 10 years, or earlier in the case of a severe and dirty wound or burn. Booster doses can be either Tdap or Td (a different vaccine that protects against tetanus and diphtheria but not pertussis). Tdap may be given at the same time as other vaccines. 3. Talk with your health care provider Tell your vaccine provider if the person getting the vaccine:  Has had an allergic reaction after a previous dose of any vaccine that protects against tetanus, diphtheria, or pertussis, or has any severe, life-threatening allergies.  Has had a coma, decreased level of consciousness, or prolonged seizures within 7 days after a previous dose of any pertussis vaccine (DTP, DTaP, or Tdap).  Has seizures or another nervous system problem.  Has ever had Guillain-Barr Syndrome (also called GBS).  Has had severe pain or swelling after a previous dose of any vaccine that protects against tetanus or diphtheria. In some cases, your health care provider may decide to postpone Tdap vaccination to a future visit.  People with minor illnesses, such as a cold, may be vaccinated. People who are moderately or severely ill should usually wait until they recover before getting Tdap vaccine.  Your health care provider can  give you more information. 4. Risks of a vaccine reaction  Pain, redness, or swelling where the shot was given, mild fever,  headache, feeling tired, and nausea, vomiting, diarrhea, or stomachache sometimes happen after Tdap vaccine. People sometimes faint after medical procedures, including vaccination. Tell your provider if you feel dizzy or have vision changes or ringing in the ears.  As with any medicine, there is a very remote chance of a vaccine causing a severe allergic reaction, other serious injury, or death. 5. What if there is a serious problem? An allergic reaction could occur after the vaccinated person leaves the clinic. If you see signs of a severe allergic reaction (hives, swelling of the face and throat, difficulty breathing, a fast heartbeat, dizziness, or weakness), call 9-1-1 and get the person to the nearest hospital. For other signs that concern you, call your health care provider.  Adverse reactions should be reported to the Vaccine Adverse Event Reporting System (VAERS). Your health care provider will usually file this report, or you can do it yourself. Visit the VAERS website at www.vaers.SamedayNews.es or call 732 298 8334. VAERS is only for reporting reactions, and VAERS staff do not give medical advice. 6. The National Vaccine Injury Compensation Program The [employer] Vaccine Injury Compensation Program (VICP) is a federal program that was created to compensate people who may have been injured by certain vaccines. Visit the VICP website at GoldCloset.com.ee or call (203)297-7298 to learn about the program and about filing a claim. There is a time limit to file a claim for compensation. 7. How can I learn more?  Ask your health care provider.  Call your local or state health department.  Contact the Centers for Disease Control and Prevention (CDC): ? Call (917)621-7260 (1-800-CDC-INFO) or ? Visit CDC's website at http://hunter.com/ Vaccine Information  Statement Tdap (Tetanus, Diphtheria, Pertussis) Vaccine (01/16/2019) This information is not intended to replace advice given to you by your health care provider. Make sure you discuss any questions you have with your health care provider. Document Revised: 01/25/2019 Document Reviewed: 01/28/2019 Elsevier Patient Education  [place].

## 2020-09-01 ENCOUNTER — Encounter: Payer: Self-pay | Admitting: Obstetrics and Gynecology

## 2020-09-01 ENCOUNTER — Other Ambulatory Visit: Payer: Self-pay

## 2020-09-01 ENCOUNTER — Ambulatory Visit (INDEPENDENT_AMBULATORY_CARE_PROVIDER_SITE_OTHER): Payer: BC Managed Care – PPO | Admitting: Obstetrics and Gynecology

## 2020-09-01 ENCOUNTER — Other Ambulatory Visit: Payer: BC Managed Care – PPO

## 2020-09-01 VITALS — BP 117/75 | HR 93 | Wt 239.9 lb

## 2020-09-01 DIAGNOSIS — Z3A27 27 weeks gestation of pregnancy: Secondary | ICD-10-CM

## 2020-09-01 DIAGNOSIS — Z23 Encounter for immunization: Secondary | ICD-10-CM

## 2020-09-01 DIAGNOSIS — O09529 Supervision of elderly multigravida, unspecified trimester: Secondary | ICD-10-CM

## 2020-09-01 DIAGNOSIS — R21 Rash and other nonspecific skin eruption: Secondary | ICD-10-CM

## 2020-09-01 DIAGNOSIS — O09299 Supervision of pregnancy with other poor reproductive or obstetric history, unspecified trimester: Secondary | ICD-10-CM

## 2020-09-01 LAB — POCT URINALYSIS DIPSTICK OB
Bilirubin, UA: NEGATIVE
Blood, UA: NEGATIVE
Glucose, UA: NEGATIVE
Ketones, UA: NEGATIVE
Leukocytes, UA: NEGATIVE
Nitrite, UA: NEGATIVE
POC,PROTEIN,UA: NEGATIVE
Spec Grav, UA: 1.015 (ref 1.010–1.025)
Urobilinogen, UA: 0.2 E.U./dL
pH, UA: 7 (ref 5.0–8.0)

## 2020-09-01 MED ORDER — ACYCLOVIR 5 % EX OINT: 1.0000 "application " | TOPICAL_OINTMENT | CUTANEOUS | 0 refills | Status: DC

## 2020-09-01 MED ORDER — TETANUS-DIPHTH-ACELL PERTUSSIS 5-2.5-18.5 LF-MCG/0.5 IM SUSY
0.5000 mL | PREFILLED_SYRINGE | Freq: Once | INTRAMUSCULAR | Status: AC
  Administered 2020-09-01: 0.5 mL via INTRAMUSCULAR

## 2020-09-01 NOTE — Progress Notes (Signed)
ROB: Notes having to eat smaller portions due to feeling full faster. Notes rash of axillary region and mouth. Denies h/o cold sores. Has used Tinactin on regions, has resolved under arm but still notes rash near mouth. Will prescribe Acyclovir ointment. Plans to breast pump. Notes difficulties with severe skin infections and denudement with breastfeeding in the past. For 28 week labs today.  Desires vasectomy for contraception. Received Tdap earlier in the year before pregnancy, signed blood consent. RTC in 2 weeks.

## 2020-09-02 LAB — CBC
Hematocrit: 32.7 % — ABNORMAL LOW (ref 34.0–46.6)
Hemoglobin: 10.9 g/dL — ABNORMAL LOW (ref 11.1–15.9)
MCH: 27.5 pg (ref 26.6–33.0)
MCHC: 33.3 g/dL (ref 31.5–35.7)
MCV: 82 fL (ref 79–97)
Platelets: 237 10*3/uL (ref 150–450)
RBC: 3.97 x10E6/uL (ref 3.77–5.28)
RDW: 13.2 % (ref 11.7–15.4)
WBC: 9.5 10*3/uL (ref 3.4–10.8)

## 2020-09-02 LAB — HEMOGLOBIN A1C
Est. average glucose Bld gHb Est-mCnc: 85 mg/dL
Hgb A1c MFr Bld: 4.6 % — ABNORMAL LOW (ref 4.8–5.6)

## 2020-09-02 LAB — GLUCOSE, 1 HOUR GESTATIONAL: Gestational Diabetes Screen: 142 mg/dL — ABNORMAL HIGH (ref 65–139)

## 2020-09-02 LAB — RPR: RPR Ser Ql: NONREACTIVE

## 2020-09-07 ENCOUNTER — Other Ambulatory Visit: Payer: Self-pay

## 2020-09-07 DIAGNOSIS — O09529 Supervision of elderly multigravida, unspecified trimester: Secondary | ICD-10-CM

## 2020-09-16 ENCOUNTER — Other Ambulatory Visit: Payer: BC Managed Care – PPO

## 2020-09-16 ENCOUNTER — Encounter: Payer: Self-pay | Admitting: Obstetrics and Gynecology

## 2020-09-16 ENCOUNTER — Other Ambulatory Visit: Payer: Self-pay

## 2020-09-16 ENCOUNTER — Ambulatory Visit (INDEPENDENT_AMBULATORY_CARE_PROVIDER_SITE_OTHER): Payer: BC Managed Care – PPO | Admitting: Obstetrics and Gynecology

## 2020-09-16 VITALS — BP 118/72 | HR 82 | Wt 244.7 lb

## 2020-09-16 DIAGNOSIS — O09529 Supervision of elderly multigravida, unspecified trimester: Secondary | ICD-10-CM

## 2020-09-16 DIAGNOSIS — D259 Leiomyoma of uterus, unspecified: Secondary | ICD-10-CM

## 2020-09-16 DIAGNOSIS — O3412 Maternal care for benign tumor of corpus uteri, second trimester: Secondary | ICD-10-CM

## 2020-09-16 DIAGNOSIS — Z3A29 29 weeks gestation of pregnancy: Secondary | ICD-10-CM

## 2020-09-16 LAB — POCT URINALYSIS DIPSTICK OB
Bilirubin, UA: NEGATIVE
Blood, UA: NEGATIVE
Ketones, UA: NEGATIVE
Leukocytes, UA: NEGATIVE
Nitrite, UA: NEGATIVE
POC,PROTEIN,UA: NEGATIVE
Spec Grav, UA: 1.01 (ref 1.010–1.025)
Urobilinogen, UA: 0.2 E.U./dL
pH, UA: 6.5 (ref 5.0–8.0)

## 2020-09-16 NOTE — Progress Notes (Signed)
ROB: Patient has restless leg and is taking magnesium and she says that it is working.  Otherwise no complaints.  She is doing her 3-hour GTT today to rule out gestational diabetes.  Ultrasound in 2 weeks for growth follow-up because of a large uterine fibroid.  Patient also has a history of macrosomia.

## 2020-09-16 NOTE — Addendum Note (Signed)
Addended by: Durwin Glaze on: 09/16/2020 10:24 AM   Modules accepted: Orders

## 2020-09-17 LAB — GESTATIONAL GLUCOSE TOLERANCE
Glucose, Fasting: 92 mg/dL (ref 65–94)
Glucose, GTT - 1 Hour: 197 mg/dL — ABNORMAL HIGH (ref 65–179)
Glucose, GTT - 2 Hour: 145 mg/dL (ref 65–154)
Glucose, GTT - 3 Hour: 80 mg/dL (ref 65–139)

## 2020-09-28 NOTE — Progress Notes (Deleted)
ROB-Pt present today for routine prenatal care.

## 2020-09-29 ENCOUNTER — Ambulatory Visit (INDEPENDENT_AMBULATORY_CARE_PROVIDER_SITE_OTHER): Payer: BC Managed Care – PPO | Admitting: Obstetrics and Gynecology

## 2020-09-29 ENCOUNTER — Other Ambulatory Visit: Payer: Self-pay

## 2020-09-29 ENCOUNTER — Encounter: Payer: BC Managed Care – PPO | Admitting: Obstetrics and Gynecology

## 2020-09-29 ENCOUNTER — Encounter: Payer: Self-pay | Admitting: Obstetrics and Gynecology

## 2020-09-29 ENCOUNTER — Ambulatory Visit (INDEPENDENT_AMBULATORY_CARE_PROVIDER_SITE_OTHER): Payer: BC Managed Care – PPO

## 2020-09-29 ENCOUNTER — Other Ambulatory Visit: Payer: BC Managed Care – PPO

## 2020-09-29 VITALS — BP 113/70 | HR 80 | Wt 248.5 lb

## 2020-09-29 DIAGNOSIS — Z3A31 31 weeks gestation of pregnancy: Secondary | ICD-10-CM

## 2020-09-29 DIAGNOSIS — O09529 Supervision of elderly multigravida, unspecified trimester: Secondary | ICD-10-CM

## 2020-09-29 DIAGNOSIS — D259 Leiomyoma of uterus, unspecified: Secondary | ICD-10-CM | POA: Diagnosis not present

## 2020-09-29 DIAGNOSIS — O3412 Maternal care for benign tumor of corpus uteri, second trimester: Secondary | ICD-10-CM | POA: Diagnosis not present

## 2020-09-29 NOTE — Patient Instructions (Signed)

## 2020-09-29 NOTE — Progress Notes (Signed)
ROB-pt present for prenatal care and u/s. Pt stated that she was doing well no problems.

## 2020-09-29 NOTE — Progress Notes (Signed)
ROB: Patient doing well. No major complaints. [place] today for growth due to fibroids, normal (61%), fibroid slightly smaller (5.2 cm vs 6 cm).  F/u growth scan again at 36 weeks.Further discussed breastfeeding, plans to pump.  Discussed pain management in labor, desires epidural. Plans for vasectomy for contraception. Abnormal 1 hr glucola with normal 3 hr GTT.  RTC in 2 weeks.     The following were addressed during this visit:  Breastfeeding Education - The importance of exclusive breastfeeding    Comments: Provides antibodies, Lower risk of breast and ovarian cancers, and type-2 diabetes,Helps your body recover, Reduced chance of SIDS.   - Nonpharmacological pain relief methods for labor    Comments: Deep breathing, focusing on pleasant things, movement and walking, heating pads or cold compress, massage and relaxation, continuous support from someone you trust, and [place]   - The importance of early skin-to-skin contact    Comments: Keeps baby warm and secure, helps keep baby's blood sugar up and breathing steady, easier to bond and breastfeed, and helps calm baby.  - Rooming-in on a 24-hour basis    Comments: Easier to learn baby's feeding cues, easier to bond and get to know each other, and encourages milk production.   - Frequent feeding to help assure optimal milk production    Comments: Making a full supply of milk requires frequent removal of milk from breasts, infant will eat 8-12 times in 24 hours, if separated from infant use breast massage, hand expression and/ or pumping to remove milk from breasts.   - Exclusive breastfeeding for the first 6 months    Comments: Builds a healthy milk supply and keeps it up, protects baby from sickness and disease, and breastmilk has everything your baby needs for the first 6 months.

## 2020-10-15 ENCOUNTER — Telehealth: Payer: Self-pay

## 2020-10-15 ENCOUNTER — Other Ambulatory Visit: Payer: Self-pay

## 2020-10-15 ENCOUNTER — Encounter: Payer: BC Managed Care – PPO | Admitting: Obstetrics and Gynecology

## 2020-10-15 ENCOUNTER — Encounter: Payer: Self-pay | Admitting: Obstetrics and Gynecology

## 2020-10-15 ENCOUNTER — Other Ambulatory Visit: Payer: Self-pay | Admitting: Internal Medicine

## 2020-10-15 ENCOUNTER — Ambulatory Visit (INDEPENDENT_AMBULATORY_CARE_PROVIDER_SITE_OTHER): Payer: BC Managed Care – PPO | Admitting: Obstetrics and Gynecology

## 2020-10-15 VITALS — BP 114/77 | HR 103 | Wt 247.3 lb

## 2020-10-15 DIAGNOSIS — Z3A33 33 weeks gestation of pregnancy: Secondary | ICD-10-CM

## 2020-10-15 DIAGNOSIS — O09529 Supervision of elderly multigravida, unspecified trimester: Secondary | ICD-10-CM

## 2020-10-15 NOTE — Progress Notes (Addendum)
ROB: Patient with multiple AMP of pregnancy.  Not sleeping well, constipated with a hemorrhoid, carpal tunnel syndrome in the right hand etc.  Have discussed methods for helping to sleep, fiber for hemorrhoids, neutral wrist splint for carpal tunnel.  States that she began having a recurrence of trigeminal neuralgia.  She has plans to contact her neurologist.  Reviewed ultrasound findings and 3-hour GTT findings again.  Patient reports daily fetal movement.  Denies contractions.  Taking prenatal vitamins and aspirin as directed.

## 2020-10-15 NOTE — Telephone Encounter (Signed)
Please see if Dr. Valentino Saxon wants to fill. Dr. Logan Bores has already left. This has been filled by patients PCP.

## 2020-10-15 NOTE — Telephone Encounter (Signed)
Pt called in and stated that she was seen today and that she forgot to tell Dr. Logan Bores she needs a refill on her Celexa sent to [facility] on [address]. Please advise

## 2020-10-16 MED ORDER — CITALOPRAM HYDROBROMIDE 40 MG PO TABS
40.0000 mg | ORAL_TABLET | Freq: Every day | ORAL | 0 refills | Status: DC
Start: 2020-10-16 — End: 2021-02-11

## 2020-10-16 NOTE — Telephone Encounter (Signed)
Good morning, Please advise. Thanks Maxten Shuler 

## 2020-10-16 NOTE — Telephone Encounter (Signed)
That's fine. We can refill it.

## 2020-10-19 LAB — POCT URINALYSIS DIPSTICK OB
Bilirubin, UA: NEGATIVE
Blood, UA: NEGATIVE
Glucose, UA: NEGATIVE
Ketones, UA: NEGATIVE
Leukocytes, UA: NEGATIVE
Nitrite, UA: NEGATIVE
Spec Grav, UA: 1.01 (ref 1.010–1.025)
Urobilinogen, UA: 0.2 E.U./dL
pH, UA: 6 (ref 5.0–8.0)

## 2020-10-19 NOTE — Addendum Note (Signed)
Addended by: Dorian Pod on: 10/19/2020 11:09 AM   Modules accepted: Orders

## 2020-10-28 ENCOUNTER — Other Ambulatory Visit: Payer: Self-pay

## 2020-10-28 ENCOUNTER — Encounter: Payer: Self-pay | Admitting: Obstetrics and Gynecology

## 2020-10-28 ENCOUNTER — Ambulatory Visit (INDEPENDENT_AMBULATORY_CARE_PROVIDER_SITE_OTHER): Payer: Self-pay | Admitting: Obstetrics and Gynecology

## 2020-10-28 VITALS — Ht 67.0 in

## 2020-10-28 DIAGNOSIS — G500 Trigeminal neuralgia: Secondary | ICD-10-CM

## 2020-10-28 DIAGNOSIS — Z20822 Contact with and (suspected) exposure to covid-19: Secondary | ICD-10-CM

## 2020-10-28 DIAGNOSIS — O09529 Supervision of elderly multigravida, unspecified trimester: Secondary | ICD-10-CM

## 2020-10-28 MED ORDER — FLUCONAZOLE 150 MG PO TABS: 150.0000 mg | ORAL_TABLET | Freq: Once | ORAL | 1 refills | Status: AC

## 2020-10-28 NOTE — Progress Notes (Signed)
Televisit-OB pt having televisit due to family member being positive for COVID.  Pt stated having some slight irritation on the outside of the vaginal area. Pt stated that she has been using Monistat on the area. Pt stated that her and the baby was doing well.

## 2020-10-28 NOTE — Progress Notes (Signed)
Virtual Visit via Telephone Note  I connected with Carmen Mathis on 10/28/20 at  2:45 PM EST by telephone and verified that I am speaking with the correct person using two identifiers.  Location: Patient: Home Provider: Office   I discussed the limitations, risks, security and privacy concerns of performing an evaluation and management service by telephone and the availability of in person appointments. I also discussed with the patient that there may be a patient responsible charge related to this service. The patient expressed understanding and agreed to proceed.   History of Present Illness: Carmen Mathis is a 40 y.o. [phone] at [redacted]w[redacted]d who presents for televist for prenatal care due to suspected COVID-19 infection (not yet tested but symptomatic and had recent COVID exposure). Currently has symptoms fever, cough, congestion. Taking Tylenol cold/flu, Tylenol, Sudafed and cold chills.  Also has trigeminal neuralgia which flared up on Sunday. Also thinks that she may be developing a yeast infection (mostly having external irritation). Is using some leftover OTC Monistat to treat the area. Patient wonders if a Diflucan can be sent to her pharmacy in case symptoms worsen or become internal.   Patient reports good fetal movement. Denies contractions, vaginal bleeding, or leaking fluid.     Observations/Objective: Height 5\' 7"  (1.702 m). Patient has not weighed herself. Last weight in office on 12/30 - 247 lb 4.8 oz (112.2 kg).   Assessment and Plan:  1. Supervision of high-risk pregnancy of elderly multigravida - continue prenatal care. Follow up in 1 week for 36 week labs in office if asymptomatic.   2. Suspected COVID-19 virus infection - advised patient to get tested (patient notes that she also needs to be tested to report results to her job). Given locations of several local testing sites.   3. Trigeminal neuralgia - noted plans for reaching out to Neurologist. Also had a flare ~ 2  weeks ago.    Follow Up Instructions:    I discussed the assessment and treatment plan with the patient. The patient was provided an opportunity to ask questions and all were answered. The patient agreed with the plan and demonstrated an understanding of the instructions.   The patient was advised to call back or seek an in-person evaluation if the symptoms worsen or if the condition fails to improve as anticipated.  I provided 10 minutes of non-face-to-face time during this encounter.   Rubie Maid, MD  [facility]

## 2020-10-28 NOTE — Patient Instructions (Addendum)
Common Medications Safe in Pregnancy  Acne:      Constipation:  Benzoyl Peroxide     Colace  Clindamycin      Dulcolax Suppository  Topica Erythromycin     Fibercon  Salicylic Acid      Metamucil         Miralax AVOID:        Senakot   Accutane    Cough:  Retin-A       Cough Drops  Tetracycline      Phenergan w/ Codeine if Rx  Minocycline      Robitussin (Plain & DM)  Antibiotics:     Crabs/Lice:  Ceclor       RID  Cephalosporins    AVOID:  E-Mycins      Kwell  Keflex  Macrobid/Macrodantin   Diarrhea:  Penicillin      Kao-Pectate  Zithromax      Imodium AD         PUSH FLUIDS AVOID:       Cipro     Fever:  Tetracycline      Tylenol (Regular or Extra  Minocycline       Strength)  Levaquin      Extra Strength-Do not          Exceed 8 tabs/24 hrs Caffeine:        <200mg/day (equiv. To 1 cup of coffee or  approx. 3 12 oz sodas)         Gas: Cold/Hayfever:       Gas-X  Benadryl      Mylicon  Claritin       Phazyme  **Claritin-D        Chlor-Trimeton    Headaches:  Dimetapp      ASA-Free Excedrin  Drixoral-Non-Drowsy     Cold Compress  Mucinex (Guaifenasin)     Tylenol (Regular or Extra  Sudafed/Sudafed-12 Hour     Strength)  **Sudafed PE Pseudoephedrine   Tylenol Cold & Sinus     Vicks Vapor Rub  Zyrtec  **AVOID if Problems With Blood Pressure         Heartburn: Avoid lying down for at least 1 hour after meals  Aciphex      Maalox     Rash:  Milk of Magnesia     Benadryl    Mylanta       1% Hydrocortisone Cream  Pepcid  Pepcid Complete   Sleep Aids:  Prevacid      Ambien   Prilosec       Benadryl  Rolaids       Chamomile Tea  Tums (Limit 4/day)     Unisom         Tylenol PM         Warm milk-add vanilla or  Hemorrhoids:       Sugar for taste  Anusol/Anusol H.C.  (RX: Analapram 2.5%)  Sugar Substitutes:  Hydrocortisone OTC     Ok in moderation  Preparation H      Tucks        Vaseline lotion applied to tissue with  wiping    Herpes:     Throat:  Acyclovir      Oragel  Famvir  Valtrex     Vaccines:         Flu Shot Leg Cramps:       *Gardasil  Benadryl      Hepatitis A         Hepatitis B Nasal Spray:         Pneumovax  Saline Nasal Spray     Polio Booster         Tetanus Nausea:       Tuberculosis test or PPD  Vitamin B6 25 mg TID   AVOID:    Dramamine      *Gardasil  Emetrol       Live Poliovirus  Ginger Root 250 mg QID    MMR (measles, mumps &  High Complex Carbs @ Bedtime    rebella)  Sea Bands-Accupressure    Varicella (Chickenpox)  Unisom 1/2 tab TID     *No known complications           If received before Pain:         Known pregnancy;   Darvocet       Resume series after  Lortab        Delivery  Percocet    Yeast:   Tramadol      Femstat  Tylenol 3      Gyne-lotrimin  Ultram       Monistat  Vicodin           MISC:         All Sunscreens           Hair Coloring/highlights          Insect Repellant's          (Including DEET)         Mystic Tans    10 Things You Can Do to Manage Your COVID-19 Symptoms at Home If you have possible or confirmed COVID-19: 1. Stay home except to get medical care. 2. Monitor your symptoms carefully. If your symptoms get worse, call your healthcare provider immediately. 3. Get rest and stay hydrated. 4. If you have a medical appointment, call the healthcare provider ahead of time and tell them that you have or may have COVID-19. 5. For medical emergencies, call 911 and notify the dispatch personnel that you have or may have COVID-19. 6. Cover your cough and sneezes with a tissue or use the inside of your elbow. 7. Wash your hands often with soap and water for at least 20 seconds or clean your hands with an alcohol-based hand sanitizer that contains at least 60% alcohol. 8. As much as possible, stay in a specific room and away from other people in your home. Also, you should use a separate bathroom, if available. If you need to be around other people  in or outside of the home, wear a mask. 9. Avoid sharing personal items with other people in your household, like dishes, towels, and bedding. 10. Clean all surfaces that are touched often, like counters, tabletops, and doorknobs. Use household cleaning sprays or wipes according to the label instructions. [email or web address] 05/01/2020 This information is not intended to replace advice given to you by your health care provider. Make sure you discuss any questions you have with your health care provider. Document Revised: 08/17/2020 Document Reviewed: 08/17/2020 Elsevier Patient Education  2021 [employer].

## 2020-10-31 ENCOUNTER — Other Ambulatory Visit: Payer: Self-pay

## 2020-11-04 ENCOUNTER — Encounter: Payer: Self-pay | Admitting: Obstetrics and Gynecology

## 2020-11-04 ENCOUNTER — Ambulatory Visit (INDEPENDENT_AMBULATORY_CARE_PROVIDER_SITE_OTHER): Payer: Self-pay | Admitting: Obstetrics and Gynecology

## 2020-11-04 ENCOUNTER — Other Ambulatory Visit: Payer: Self-pay

## 2020-11-04 VITALS — BP 116/82 | HR 105 | Wt 250.3 lb

## 2020-11-04 DIAGNOSIS — Z3A36 36 weeks gestation of pregnancy: Secondary | ICD-10-CM

## 2020-11-04 DIAGNOSIS — O09529 Supervision of elderly multigravida, unspecified trimester: Secondary | ICD-10-CM

## 2020-11-04 LAB — POCT URINALYSIS DIPSTICK OB
Bilirubin, UA: NEGATIVE
Blood, UA: NEGATIVE
Glucose, UA: NEGATIVE
Ketones, UA: NEGATIVE
Leukocytes, UA: NEGATIVE
Nitrite, UA: NEGATIVE
Spec Grav, UA: 1.01 (ref 1.010–1.025)
Urobilinogen, UA: 0.2 E.U./dL
pH, UA: 6.5 (ref 5.0–8.0)

## 2020-11-04 NOTE — Progress Notes (Signed)
ROB: Patient "over" being pregnant.  Reports daily fetal movement.  Denies contractions.  Complains of restless leg syndrome and difficulty sleeping.  She is already trying stretching relaxation techniques no caffeine etc. with little success. GBS-GC/CT performed.

## 2020-11-06 LAB — GC/CHLAMYDIA PROBE AMP
Chlamydia trachomatis, NAA: NEGATIVE
Neisseria Gonorrhoeae by PCR: NEGATIVE

## 2020-11-06 LAB — STREP GP B NAA: Strep Gp B NAA: NEGATIVE

## 2020-11-09 NOTE — Patient Instructions (Signed)

## 2020-11-10 ENCOUNTER — Ambulatory Visit (INDEPENDENT_AMBULATORY_CARE_PROVIDER_SITE_OTHER): Payer: Self-pay | Admitting: Obstetrics and Gynecology

## 2020-11-10 ENCOUNTER — Encounter: Payer: Self-pay | Admitting: Obstetrics and Gynecology

## 2020-11-10 ENCOUNTER — Other Ambulatory Visit: Payer: Self-pay

## 2020-11-10 ENCOUNTER — Telehealth: Payer: Self-pay

## 2020-11-10 VITALS — BP 135/77 | HR 87 | Ht 67.0 in | Wt 253.7 lb

## 2020-11-10 DIAGNOSIS — O09293 Supervision of pregnancy with other poor reproductive or obstetric history, third trimester: Secondary | ICD-10-CM

## 2020-11-10 DIAGNOSIS — D259 Leiomyoma of uterus, unspecified: Secondary | ICD-10-CM

## 2020-11-10 DIAGNOSIS — Z3A37 37 weeks gestation of pregnancy: Secondary | ICD-10-CM

## 2020-11-10 DIAGNOSIS — O09529 Supervision of elderly multigravida, unspecified trimester: Secondary | ICD-10-CM

## 2020-11-10 DIAGNOSIS — O26899 Other specified pregnancy related conditions, unspecified trimester: Secondary | ICD-10-CM

## 2020-11-10 DIAGNOSIS — O3413 Maternal care for benign tumor of corpus uteri, third trimester: Secondary | ICD-10-CM

## 2020-11-10 DIAGNOSIS — G5600 Carpal tunnel syndrome, unspecified upper limb: Secondary | ICD-10-CM

## 2020-11-10 LAB — POCT URINALYSIS DIPSTICK OB
Bilirubin, UA: NEGATIVE
Blood, UA: NEGATIVE
Glucose, UA: NEGATIVE
Ketones, UA: NEGATIVE
Leukocytes, UA: NEGATIVE
Nitrite, UA: NEGATIVE
POC,PROTEIN,UA: NEGATIVE
Spec Grav, UA: 1.01 (ref 1.010–1.025)
Urobilinogen, UA: 0.2 E.U./dL
pH, UA: 6.5 (ref 5.0–8.0)

## 2020-11-10 NOTE — Progress Notes (Signed)
ROB-Pt present routine prenatal care. Pt stated that she was doing well no problems.

## 2020-11-10 NOTE — Progress Notes (Signed)
ROB: Doing better. Was dealing with COVID infection last week. Still has issues with carpal tunnel. Doing better with sleeping. Discussed need for antenatal testing for AMA astatusand repeat ultrasound for f/u growth for fibroids and h/o macrosomia. Will schedule. Also discussed delivery by 40 weeks due to [place] status. NST performed today was reviewed and was found to be reactive.  Continue recommended antenatal testing and prenatal care.   NONSTRESS TEST INTERPRETATION  INDICATIONS:  AMA  FHR baseline: 125 bpm RESULTS:Reactive COMMENTS: contractions q 3-4 minutes (undetectable by patient) with uterine irritability.    PLAN: 1. Continue fetal kick counts twice a day. 2. Continue antepartum testing as scheduled-weekly

## 2020-11-10 NOTE — Telephone Encounter (Signed)
When checking in this patient I brought to her attention that I needed her Medicaid card, patient states she hasn't gotten it yet. Informed patient that whenever I try to run her insurance it says its INACTIVE. Asked patient to make a payment on her OB payment plan until we got her medicaid registered however she declined to make a payment. Tried to inform patient that it was per our office policy however she still declined.

## 2020-11-16 NOTE — Telephone Encounter (Signed)
Pt aware Medicaid is not active. Pt states she has applied less than a month ago. Informed pt that office policy requires [place] to take a payment per the insurance we have on file. Pt informed and verbally understood she is expected to make a payment until her medicaid is active.  Pt aware amt is 182.09 and due at [place].Pt voiced understanding. CM

## 2020-11-17 DIAGNOSIS — Z0289 Encounter for other administrative examinations: Secondary | ICD-10-CM

## 2020-11-18 ENCOUNTER — Ambulatory Visit (INDEPENDENT_AMBULATORY_CARE_PROVIDER_SITE_OTHER): Payer: BC Managed Care – PPO

## 2020-11-18 ENCOUNTER — Other Ambulatory Visit: Payer: Self-pay

## 2020-11-18 ENCOUNTER — Ambulatory Visit (INDEPENDENT_AMBULATORY_CARE_PROVIDER_SITE_OTHER): Payer: BC Managed Care – PPO | Admitting: Obstetrics and Gynecology

## 2020-11-18 ENCOUNTER — Encounter: Payer: Self-pay | Admitting: Obstetrics and Gynecology

## 2020-11-18 ENCOUNTER — Other Ambulatory Visit (INDEPENDENT_AMBULATORY_CARE_PROVIDER_SITE_OTHER): Payer: Self-pay

## 2020-11-18 VITALS — BP 115/77 | HR 90 | Wt 256.2 lb

## 2020-11-18 DIAGNOSIS — D259 Leiomyoma of uterus, unspecified: Secondary | ICD-10-CM

## 2020-11-18 DIAGNOSIS — Z3A38 38 weeks gestation of pregnancy: Secondary | ICD-10-CM

## 2020-11-18 DIAGNOSIS — O09529 Supervision of elderly multigravida, unspecified trimester: Secondary | ICD-10-CM

## 2020-11-18 DIAGNOSIS — O321XX0 Maternal care for breech presentation, not applicable or unspecified: Secondary | ICD-10-CM

## 2020-11-18 DIAGNOSIS — O09523 Supervision of elderly multigravida, third trimester: Secondary | ICD-10-CM

## 2020-11-18 DIAGNOSIS — O3413 Maternal care for benign tumor of corpus uteri, third trimester: Secondary | ICD-10-CM

## 2020-11-18 DIAGNOSIS — O09293 Supervision of pregnancy with other poor reproductive or obstetric history, third trimester: Secondary | ICD-10-CM

## 2020-11-18 LAB — POCT URINALYSIS DIPSTICK OB
Bilirubin, UA: NEGATIVE
Blood, UA: NEGATIVE
Glucose, UA: NEGATIVE
Ketones, UA: NEGATIVE
Leukocytes, UA: NEGATIVE
Nitrite, UA: NEGATIVE
POC,PROTEIN,UA: NEGATIVE
Spec Grav, UA: <=1.005 — AB (ref 1.010–1.025)
Urobilinogen, UA: 0.2 E.U./dL
pH, UA: 7 (ref 5.0–8.0)

## 2020-11-18 NOTE — Progress Notes (Signed)
ROB: Patient generally doing well.  Continues to wear carpal tunnel braces bilaterally.  Reports fetal movement.  Growth and BPP done today.  Growth equals 8 pounds 3 ounces.  BPP 8/8.  Baby breech.  Polyhydramnios 31cm fluid. We have discussed all of the above ultrasound results in detail.  Ramifications of breech polyhydramnios discussed.  Possibility of external cephalic version versus expectant management versus elective cesarean delivery discussed.  Risks and benefits of each discussed.  Patient would like some time to finalize her decision but at the end of her visit she was leaning toward scheduled cesarean but if the baby was vertex on that day immediate induction of labor.  Scheduled for Thursday, February 10.

## 2020-11-24 ENCOUNTER — Other Ambulatory Visit: Payer: Self-pay

## 2020-11-24 ENCOUNTER — Other Ambulatory Visit
Admission: RE | Admit: 2020-11-24 | Discharge: 2020-11-24 | Disposition: A | Discharge status: Home / Self Care | Payer: BC Managed Care – PPO | Source: Ambulatory Visit | Attending: Obstetrics and Gynecology | Admitting: Obstetrics and Gynecology

## 2020-11-24 ENCOUNTER — Encounter: Payer: Self-pay | Admitting: Obstetrics and Gynecology

## 2020-11-24 ENCOUNTER — Ambulatory Visit (INDEPENDENT_AMBULATORY_CARE_PROVIDER_SITE_OTHER): Payer: Self-pay | Admitting: Obstetrics and Gynecology

## 2020-11-24 ENCOUNTER — Encounter
Admission: RE | Admit: 2020-11-24 | Discharge: 2020-11-24 | Disposition: A | Discharge status: Home / Self Care | Payer: BC Managed Care – PPO | Source: Ambulatory Visit | Attending: Obstetrics and Gynecology | Admitting: Obstetrics and Gynecology

## 2020-11-24 VITALS — BP 118/72 | HR 80 | Wt 253.9 lb

## 2020-11-24 DIAGNOSIS — O321XX0 Maternal care for breech presentation, not applicable or unspecified: Secondary | ICD-10-CM

## 2020-11-24 DIAGNOSIS — Z3A39 39 weeks gestation of pregnancy: Secondary | ICD-10-CM

## 2020-11-24 DIAGNOSIS — O409XX0 Polyhydramnios, unspecified trimester, not applicable or unspecified: Secondary | ICD-10-CM

## 2020-11-24 DIAGNOSIS — O09529 Supervision of elderly multigravida, unspecified trimester: Secondary | ICD-10-CM

## 2020-11-24 LAB — POCT URINALYSIS DIPSTICK OB
Bilirubin, UA: NEGATIVE
Blood, UA: NEGATIVE
Glucose, UA: NEGATIVE
Ketones, UA: NEGATIVE
Nitrite, UA: NEGATIVE
POC,PROTEIN,UA: NEGATIVE
Spec Grav, UA: 1.01 (ref 1.010–1.025)
Urobilinogen, UA: 0.2 E.U./dL
pH, UA: 7.5 (ref 5.0–8.0)

## 2020-11-24 NOTE — Patient Instructions (Signed)
Cesarean Delivery Cesarean birth, or cesarean delivery, is the surgical delivery of a baby through an incision in the abdomen and the uterus. This may be referred to as a C-section. This procedure may be scheduled ahead of time, or it may be done in an emergency situation. Tell a health care provider about:  Any allergies you have.  All medicines you are taking, including vitamins, herbs, eye drops, creams, and over-the-counter medicines.  Any problems you or family members have had with anesthetic medicines.  Any blood disorders you have.  Any surgeries you have had.  Any medical conditions you have.  Whether you or any members of your family have a history of deep vein thrombosis (DVT) or pulmonary embolism (PE). What are the risks? Generally, this is a safe procedure. However, problems may occur, including:  Infection.  Bleeding.  Allergic reactions to medicines.  Damage to other structures or organs.  Blood clots.  Injury to your baby. What happens before the procedure? General instructions  Follow instructions from your health care provider about eating or drinking restrictions.  If you know that you are going to have a cesarean delivery, do not shave your pubic area. Shaving before the procedure may increase your risk of infection.  Plan to have someone take you home from the hospital.  Ask your health care provider what steps will be taken to prevent infection. These may include: ? Removing hair at the surgery site. ? Washing skin with a germ-killing soap. ? Taking antibiotic medicine.  Depending on the reason for your cesarean delivery, you may have a physical exam or additional testing, such as an ultrasound.  You may have your blood or urine tested. Questions for your health care provider  Ask your health care provider about: ? Changing or stopping your regular medicines. This is especially important if you are taking diabetes medicines or blood  thinners. ? Your pain management plan. This is especially important if you plan to breastfeed your baby. ? How long you will be in the hospital after the procedure. ? Any concerns you may have about receiving blood products, if you need them during the procedure. ? Cord blood banking, if you plan to collect your baby's umbilical cord blood.  You may also want to ask your health care provider: ? Whether you will be able to hold or breastfeed your baby while you are still in the operating room. ? Whether your baby can stay with you immediately after the procedure and during your recovery. ? Whether a family member or a person of your choice can go with you into the operating room and stay with you during the procedure, immediately after the procedure, and during your recovery. What happens during the procedure?  An IV will be inserted into one of your veins.  Fluid and medicines, such as antibiotics, will be given before the surgery.  Fetal monitors will be placed on your abdomen to check your baby's heart rate.  You may be given a special warming gown to wear to keep your temperature stable.  A catheter may be inserted into your bladder through your urethra. This drains your urine during the procedure.  You may be given one or more of the following: ? A medicine to numb the area (local anesthetic). ? A medicine to make you fall asleep (general anesthetic). ? A medicine (regional anesthetic) that is injected into your back or through a small thin tube placed in your back (spinal anesthetic or epidural anesthetic). This   numbs everything below the injection site and allows you to stay awake during your procedure. If this makes you feel nauseous, tell your health care provider. Medicines will be available to help reduce any nausea you may feel.  An incision will be made in your abdomen, and then in your uterus.  If you are awake during your procedure, you may feel tugging and pulling in your  abdomen, but you should not feel pain. If you feel pain, tell your health care provider immediately.  Your baby will be removed from your uterus. You may feel more pressure or pushing while this happens.  Immediately after birth, your baby will be dried and kept warm. You may be able to hold and breastfeed your baby.  The umbilical cord may be clamped and cut during this time. This usually occurs after waiting a period of 1-2 minutes after delivery.  Your placenta will be removed from your uterus.  Your incisions will be closed with stitches (sutures). Staples, skin glue, or adhesive strips may also be applied to the incision in your abdomen.  Bandages (dressings) may be placed over the incision in your abdomen. The procedure may vary among health care providers and hospitals.   What happens after the procedure?  Your blood pressure, heart rate, breathing rate, and blood oxygen level will be monitored until you are discharged from the hospital.  You may continue to receive fluids and medicines through an IV.  You will have some pain. Medicines will be available to help control your pain.  To help prevent blood clots: ? You may be given medicines. ? You may have to wear compression stockings or devices. ? You will be encouraged to walk around when you are able.  Hospital staff will encourage and support bonding with your baby. Your hospital may have you and your baby to stay in the same room (rooming in) during your hospital stay to encourage successful bonding and breastfeeding.  You may be encouraged to cough and breathe deeply often. This helps to prevent lung problems.  If you have a catheter draining your urine, it will be removed as soon as possible after your procedure. Summary  Cesarean birth, or cesarean delivery, is the surgical delivery of a baby through an incision in the abdomen and the uterus.  Follow instructions from your health care provider about eating or  drinking restrictions before the procedure.  You will have some pain after the procedure. Medicines will be available to help control your pain.  Hospital staff will encourage and support bonding with your baby after the procedure. Your hospital may have you and your baby to stay in the same room (rooming in) during your hospital stay to encourage successful bonding and breastfeeding. This information is not intended to replace advice given to you by your health care provider. Make sure you discuss any questions you have with your health care provider. Document Revised: 06/01/2020 Document Reviewed: 04/09/2018 Elsevier Patient Education  2021 Elsevier Inc.   

## 2020-11-24 NOTE — Progress Notes (Signed)
ROB: Patient doing well, no major complaints. Answered questions about C-section (scheduled for 11/26/2020). Notes that if C-section performed would like tubal ligation. Discussed BTL and is aware of other contraceptive options. Still plans for vaginal birth if fetus does turn and is no longer breech. H/o COVID (less than 90 days) so does not require screening.

## 2020-11-24 NOTE — Patient Instructions (Signed)
Your procedure is scheduled on: 2/10 Report to [facility] Entrance and call [phone] and you will be escorted to L&D  Remember: Instructions that are not followed completely may result in serious medical risk,  up to and including death, or upon the discretion of your surgeon and anesthesiologist your  surgery may need to be rescheduled.     _X__ 1. Do not eat food after midnight the night before your procedure.                 No chewing gum or hard candies. You may drink clear liquids up to 2 hours                 before you are scheduled to arrive for your surgery- DO not drink clear                 liquids within 2 hours of the start of your surgery.                 Clear Liquids include:  water, apple juice without pulp, clear Gatorade, G2 or                  Gatorade Zero (avoid Red/Purple/Blue), Black Coffee or Tea (Do not add                 anything to coffee or tea). _____2.   Complete the "Ensure Clear Pre-surgery Clear Carbohydrate Drink" provided to you, 2 hours before arrival. **If you       are diabetic you will be provided with an alternative drink, Gatorade Zero or G2.  __X__2.  On the morning of surgery brush your teeth with toothpaste and water, you                may rinse your mouth with mouthwash if you wish.  Do not swallow any toothpaste of mouthwash.     ___ 3.  No Alcohol for 24 hours before or after surgery.   ___ 4.  Do Not Smoke or use e-cigarettes For 24 Hours Prior to Your Surgery.                 Do not use any chewable tobacco products for at least 6 hours prior to                 Surgery.  ___  5.  Do not use any recreational drugs (marijuana, cocaine, heroin, ecstasy, MDMA or other)                For at least one week prior to your surgery.  Combination of these drugs with anesthesia                May have life threatening results.  ____  6.  Bring all medications with you on the day of surgery if instructed.   _x___  7.   Notify your doctor if there is any change in your medical condition      (cold, fever, infections).     Do not wear jewelry, make-up, hairpins, clips or nail polish. Do not wear lotions, powders, or perfumes. You may wear deodorant. Do not shave 48 hours prior to surgery. Do not bring valuables to the hospital.    [facility] is not responsible for any belongings or valuables.  Contacts, dentures or bridgework may not be worn into surgery. Leave your suitcase in the car. After surgery it may be brought to your room.  For patients admitted to the hospital, discharge time is determined by your treatment team.   Patients discharged the day of surgery will not be allowed to drive home.   Make arrangements for someone to be with you for the first 24 hours of your Same Day Discharge.    Please read over the following fact sheets that you were given:   ____ Take these medicines the morning of surgery with A SIP OF WATER:    1. none  2.   3.   4.  5.  6.  ____ Fleet Enema (as directed)   _x__ Shower the night before and the morning of the surgery  ____ Use Benzoyl Peroxide Gel as instructed  ____ Use inhalers on the day of surgery  ____ Stop metformin 2 days prior to surgery    ____ Take 1/2 of usual insulin dose the night before surgery. No insulin the morning          of surgery.   __x__ May continue aspirin  ____ Stop Anti-inflammatories on    __x__ May continue supplements     ____ Bring C-Pap to the hospital.    If you have any questions regarding your pre-procedure instructions,  Please call L&D at [phone]

## 2020-11-24 NOTE — Progress Notes (Signed)
ROB-Pt present for routine prenatal care. Pt stated that she was doing well no problems.

## 2020-11-25 ENCOUNTER — Other Ambulatory Visit: Payer: Self-pay

## 2020-11-26 ENCOUNTER — Encounter
Admission: RE | Disposition: A | Discharge status: Home / Self Care | Payer: Self-pay | Source: Home / Self Care | Attending: Obstetrics and Gynecology

## 2020-11-26 ENCOUNTER — Encounter: Payer: Self-pay | Admitting: Registered Nurse

## 2020-11-26 ENCOUNTER — Inpatient Hospital Stay: Payer: BC Managed Care – PPO

## 2020-11-26 ENCOUNTER — Inpatient Hospital Stay: Payer: BC Managed Care – PPO | Admitting: Anesthesiology

## 2020-11-26 ENCOUNTER — Inpatient Hospital Stay
Admission: RE | Admit: 2020-11-26 | Discharge: 2020-11-28 | DRG: 784 | Disposition: A | Discharge status: Home / Self Care | Payer: BC Managed Care – PPO | Attending: Obstetrics and Gynecology | Admitting: Obstetrics and Gynecology

## 2020-11-26 ENCOUNTER — Encounter: Payer: Self-pay | Admitting: Obstetrics and Gynecology

## 2020-11-26 ENCOUNTER — Other Ambulatory Visit: Payer: Self-pay

## 2020-11-26 DIAGNOSIS — Z322 Encounter for childbirth instruction: Secondary | ICD-10-CM

## 2020-11-26 DIAGNOSIS — F419 Anxiety disorder, unspecified: Secondary | ICD-10-CM | POA: Diagnosis present

## 2020-11-26 DIAGNOSIS — D62 Acute posthemorrhagic anemia: Secondary | ICD-10-CM | POA: Diagnosis not present

## 2020-11-26 DIAGNOSIS — O4593 Premature separation of placenta, unspecified, third trimester: Secondary | ICD-10-CM | POA: Diagnosis present

## 2020-11-26 DIAGNOSIS — O9081 Anemia of the puerperium: Secondary | ICD-10-CM | POA: Diagnosis not present

## 2020-11-26 DIAGNOSIS — O99344 Other mental disorders complicating childbirth: Secondary | ICD-10-CM | POA: Diagnosis present

## 2020-11-26 DIAGNOSIS — D259 Leiomyoma of uterus, unspecified: Secondary | ICD-10-CM | POA: Diagnosis present

## 2020-11-26 DIAGNOSIS — Z302 Encounter for sterilization: Secondary | ICD-10-CM | POA: Diagnosis not present

## 2020-11-26 DIAGNOSIS — F32A Depression, unspecified: Secondary | ICD-10-CM | POA: Diagnosis present

## 2020-11-26 DIAGNOSIS — Z20822 Contact with and (suspected) exposure to covid-19: Secondary | ICD-10-CM | POA: Diagnosis present

## 2020-11-26 DIAGNOSIS — Z3A39 39 weeks gestation of pregnancy: Secondary | ICD-10-CM | POA: Diagnosis not present

## 2020-11-26 DIAGNOSIS — O329XX0 Maternal care for malpresentation of fetus, unspecified, not applicable or unspecified: Secondary | ICD-10-CM

## 2020-11-26 DIAGNOSIS — O99214 Obesity complicating childbirth: Principal | ICD-10-CM | POA: Diagnosis present

## 2020-11-26 DIAGNOSIS — O3413 Maternal care for benign tumor of corpus uteri, third trimester: Secondary | ICD-10-CM | POA: Diagnosis present

## 2020-11-26 DIAGNOSIS — Z9889 Other specified postprocedural states: Secondary | ICD-10-CM

## 2020-11-26 DIAGNOSIS — O09523 Supervision of elderly multigravida, third trimester: Secondary | ICD-10-CM

## 2020-11-26 DIAGNOSIS — O4590 Premature separation of placenta, unspecified, unspecified trimester: Secondary | ICD-10-CM

## 2020-11-26 DIAGNOSIS — E669 Obesity, unspecified: Secondary | ICD-10-CM

## 2020-11-26 LAB — CBC
HCT: 33.3 % — ABNORMAL LOW (ref 36.0–46.0)
Hemoglobin: 10.1 g/dL — ABNORMAL LOW (ref 12.0–15.0)
MCH: 23.4 pg — ABNORMAL LOW (ref 26.0–34.0)
MCHC: 30.3 g/dL (ref 30.0–36.0)
MCV: 77.1 fL — ABNORMAL LOW (ref 80.0–100.0)
Platelets: 255 10*3/uL (ref 150–400)
RBC: 4.32 MIL/uL (ref 3.87–5.11)
RDW: 15.8 % — ABNORMAL HIGH (ref 11.5–15.5)
WBC: 11.3 10*3/uL — ABNORMAL HIGH (ref 4.0–10.5)
nRBC: 0.2 % (ref 0.0–0.2)

## 2020-11-26 LAB — RESP PANEL BY RT-PCR (FLU A&B, COVID) ARPGX2
Influenza A by PCR: NEGATIVE
Influenza B by PCR: NEGATIVE
SARS Coronavirus 2 by RT PCR: NEGATIVE

## 2020-11-26 LAB — HEMOGLOBIN AND HEMATOCRIT, BLOOD
HCT: 24.3 % — ABNORMAL LOW (ref 36.0–46.0)
Hemoglobin: 7.9 g/dL — ABNORMAL LOW (ref 12.0–15.0)

## 2020-11-26 LAB — MASSIVE TRANSFUSION PROTOCOL ORDER (BLOOD BANK NOTIFICATION)

## 2020-11-26 SURGERY — Surgical Case
Anesthesia: General

## 2020-11-26 MED ORDER — MENTHOL 3 MG MT LOZG
1.0000 | LOZENGE | OROMUCOSAL | Status: DC | PRN
  Filled 2020-11-26: qty 9

## 2020-11-26 MED ORDER — ALBUMIN HUMAN 5 % IV SOLN
INTRAVENOUS | Status: AC
  Filled 2020-11-26: qty 250

## 2020-11-26 MED ORDER — FENTANYL CITRATE (PF) 100 MCG/2ML IJ SOLN
INTRAMUSCULAR | Status: AC
  Filled 2020-11-26: qty 2

## 2020-11-26 MED ORDER — ZOLPIDEM TARTRATE 5 MG PO TABS: 5.0000 mg | ORAL_TABLET | Freq: Every evening | ORAL | Status: DC | PRN

## 2020-11-26 MED ORDER — LACTATED RINGERS IV SOLN: INTRAVENOUS | Status: DC

## 2020-11-26 MED ORDER — OXYTOCIN-SODIUM CHLORIDE 30-0.9 UT/500ML-% IV SOLN
INTRAVENOUS | Status: AC
  Filled 2020-11-26: qty 500

## 2020-11-26 MED ORDER — CEFAZOLIN SODIUM-DEXTROSE 2-4 GM/100ML-% IV SOLN
2.0000 g | Freq: Three times a day (TID) | INTRAVENOUS | Status: DC
  Administered 2020-11-26: 2 g via INTRAVENOUS
  Filled 2020-11-26 (×3): qty 100

## 2020-11-26 MED ORDER — NALBUPHINE HCL 10 MG/ML IJ SOLN
5.0000 mg | INTRAMUSCULAR | Status: DC | PRN
  Filled 2020-11-26: qty 0.5

## 2020-11-26 MED ORDER — NALOXONE HCL 4 MG/10ML IJ SOLN
1.0000 ug/kg/h | INTRAVENOUS | Status: DC | PRN
  Filled 2020-11-26: qty 5

## 2020-11-26 MED ORDER — SODIUM CHLORIDE 0.9% FLUSH: 3.0000 mL | INTRAVENOUS | Status: DC | PRN

## 2020-11-26 MED ORDER — LIDOCAINE 5 % EX PTCH
MEDICATED_PATCH | CUTANEOUS | Status: AC
  Filled 2020-11-26: qty 1

## 2020-11-26 MED ORDER — FENTANYL CITRATE (PF) 100 MCG/2ML IJ SOLN
INTRAMUSCULAR | Status: DC | PRN
  Administered 2020-11-26: 25 ug via INTRAVENOUS
  Administered 2020-11-26 (×2): 50 ug via INTRAVENOUS
  Administered 2020-11-26: 25 ug via INTRAVENOUS

## 2020-11-26 MED ORDER — MIDAZOLAM HCL 2 MG/2ML IJ SOLN
INTRAMUSCULAR | Status: AC
  Filled 2020-11-26: qty 2

## 2020-11-26 MED ORDER — OXYTOCIN 10 UNIT/ML IJ SOLN
INTRAMUSCULAR | Status: AC
  Filled 2020-11-26: qty 2

## 2020-11-26 MED ORDER — SODIUM CHLORIDE 0.9 % IV SOLN
INTRAVENOUS | Status: DC | PRN
  Administered 2020-11-26: 40 ug/min via INTRAVENOUS

## 2020-11-26 MED ORDER — MORPHINE SULFATE (PF) 0.5 MG/ML IJ SOLN
INTRAMUSCULAR | Status: AC
  Filled 2020-11-26: qty 10

## 2020-11-26 MED ORDER — MISOPROSTOL 50MCG HALF TABLET
ORAL_TABLET | ORAL | Status: AC
  Administered 2020-11-26: 50 ug via VAGINAL
  Filled 2020-11-26: qty 1

## 2020-11-26 MED ORDER — SUCCINYLCHOLINE CHLORIDE 200 MG/10ML IV SOSY
PREFILLED_SYRINGE | INTRAVENOUS | Status: AC
  Filled 2020-11-26: qty 10

## 2020-11-26 MED ORDER — DIPHENHYDRAMINE HCL 25 MG PO CAPS: 25.0000 mg | ORAL_CAPSULE | Freq: Four times a day (QID) | ORAL | Status: DC | PRN

## 2020-11-26 MED ORDER — OXYTOCIN-SODIUM CHLORIDE 30-0.9 UT/500ML-% IV SOLN
INTRAVENOUS | Status: DC | PRN
  Administered 2020-11-26 (×2): 30 [IU] via INTRAVENOUS

## 2020-11-26 MED ORDER — METHYLERGONOVINE MALEATE 0.2 MG/ML IJ SOLN
INTRAMUSCULAR | Status: AC
  Filled 2020-11-26: qty 1

## 2020-11-26 MED ORDER — METHYLERGONOVINE MALEATE 0.2 MG/ML IJ SOLN
INTRAMUSCULAR | Status: DC | PRN
  Administered 2020-11-26: .2 mg via INTRAMUSCULAR

## 2020-11-26 MED ORDER — LIDOCAINE 5 % EX PTCH
MEDICATED_PATCH | CUTANEOUS | Status: DC | PRN
  Administered 2020-11-26: 1 via TRANSDERMAL

## 2020-11-26 MED ORDER — KETOROLAC TROMETHAMINE 30 MG/ML IJ SOLN: 30.0000 mg | Freq: Four times a day (QID) | INTRAMUSCULAR | Status: AC | PRN

## 2020-11-26 MED ORDER — SIMETHICONE 80 MG PO CHEW
80.0000 mg | CHEWABLE_TABLET | Freq: Four times a day (QID) | ORAL | Status: DC
  Administered 2020-11-26 – 2020-11-28 (×7): 80 mg via ORAL
  Filled 2020-11-26 (×7): qty 1

## 2020-11-26 MED ORDER — TRANEXAMIC ACID-NACL 1000-0.7 MG/100ML-% IV SOLN: 1000.0000 mg | INTRAVENOUS | Status: AC

## 2020-11-26 MED ORDER — ONDANSETRON HCL 4 MG/2ML IJ SOLN: 4.0000 mg | Freq: Three times a day (TID) | INTRAMUSCULAR | Status: DC | PRN

## 2020-11-26 MED ORDER — PRENATAL MULTIVITAMIN CH
1.0000 | ORAL_TABLET | Freq: Every day | ORAL | Status: DC
  Administered 2020-11-27 – 2020-11-28 (×2): 1 via ORAL
  Filled 2020-11-26 (×2): qty 1

## 2020-11-26 MED ORDER — METHYLERGONOVINE MALEATE 0.2 MG/ML IJ SOLN
INTRAMUSCULAR | Status: DC | PRN
  Administered 2020-11-26: 0.2 mg via INTRAMUSCULAR

## 2020-11-26 MED ORDER — IBUPROFEN 800 MG PO TABS
800.0000 mg | ORAL_TABLET | Freq: Three times a day (TID) | ORAL | Status: DC
  Administered 2020-11-26 – 2020-11-28 (×5): 800 mg via ORAL
  Filled 2020-11-26 (×5): qty 1

## 2020-11-26 MED ORDER — SCOPOLAMINE 1 MG/3DAYS TD PT72: 1.0000 | MEDICATED_PATCH | Freq: Once | TRANSDERMAL | Status: DC

## 2020-11-26 MED ORDER — MISOPROSTOL 25 MCG QUARTER TABLET
25.0000 ug | ORAL_TABLET | ORAL | Status: DC
  Filled 2020-11-26 (×4): qty 1

## 2020-11-26 MED ORDER — ONDANSETRON HCL 4 MG/2ML IJ SOLN
INTRAMUSCULAR | Status: AC
  Filled 2020-11-26: qty 4

## 2020-11-26 MED ORDER — ALBUMIN HUMAN 5 % IV SOLN: INTRAVENOUS | Status: DC | PRN

## 2020-11-26 MED ORDER — NALBUPHINE HCL 10 MG/ML IJ SOLN
5.0000 mg | Freq: Once | INTRAMUSCULAR | Status: DC | PRN
  Filled 2020-11-26: qty 0.5

## 2020-11-26 MED ORDER — MEPERIDINE HCL 50 MG/ML IJ SOLN: 6.2500 mg | INTRAMUSCULAR | Status: DC | PRN

## 2020-11-26 MED ORDER — DEXAMETHASONE SODIUM PHOSPHATE 10 MG/ML IJ SOLN
INTRAMUSCULAR | Status: AC
  Filled 2020-11-26: qty 1

## 2020-11-26 MED ORDER — ONDANSETRON HCL 4 MG/2ML IJ SOLN
INTRAMUSCULAR | Status: DC | PRN
  Administered 2020-11-26: 4 mg via INTRAVENOUS

## 2020-11-26 MED ORDER — LACTATED RINGERS IV SOLN: INTRAVENOUS | Status: DC | PRN

## 2020-11-26 MED ORDER — OXYCODONE-ACETAMINOPHEN 5-325 MG PO TABS
1.0000 | ORAL_TABLET | ORAL | Status: DC | PRN
  Administered 2020-11-26: 2 via ORAL
  Administered 2020-11-26 – 2020-11-28 (×7): 1 via ORAL
  Filled 2020-11-26: qty 1
  Filled 2020-11-26 (×2): qty 2
  Filled 2020-11-26 (×5): qty 1

## 2020-11-26 MED ORDER — FENTANYL CITRATE (PF) 100 MCG/2ML IJ SOLN: 25.0000 ug | INTRAMUSCULAR | Status: DC | PRN

## 2020-11-26 MED ORDER — CEFAZOLIN SODIUM-DEXTROSE 2-4 GM/100ML-% IV SOLN
INTRAVENOUS | Status: AC
  Filled 2020-11-26: qty 100

## 2020-11-26 MED ORDER — FENTANYL 2.5 MCG/ML W/ROPIVACAINE 0.15% IN NS 100 ML EPIDURAL (ARMC)
EPIDURAL | Status: AC
  Filled 2020-11-26: qty 100

## 2020-11-26 MED ORDER — MISOPROSTOL 200 MCG PO TABS
ORAL_TABLET | ORAL | Status: AC
  Filled 2020-11-26: qty 5

## 2020-11-26 MED ORDER — SUCCINYLCHOLINE CHLORIDE 20 MG/ML IJ SOLN
INTRAMUSCULAR | Status: DC | PRN
  Administered 2020-11-26: 140 mg via INTRAVENOUS

## 2020-11-26 MED ORDER — MISOPROSTOL 200 MCG PO TABS
ORAL_TABLET | ORAL | Status: AC
  Filled 2020-11-26: qty 4

## 2020-11-26 MED ORDER — MISOPROSTOL 25 MCG QUARTER TABLET
50.0000 ug | ORAL_TABLET | ORAL | Status: DC
  Filled 2020-11-26 (×4): qty 2

## 2020-11-26 MED ORDER — MIDAZOLAM HCL 2 MG/2ML IJ SOLN
INTRAMUSCULAR | Status: DC | PRN
  Administered 2020-11-26: 2 mg via INTRAVENOUS

## 2020-11-26 MED ORDER — LIDOCAINE HCL (PF) 1 % IJ SOLN
INTRAMUSCULAR | Status: AC
  Filled 2020-11-26: qty 30

## 2020-11-26 MED ORDER — ORAL CARE MOUTH RINSE: 15.0000 mL | Freq: Once | OROMUCOSAL | Status: DC

## 2020-11-26 MED ORDER — LIDOCAINE HCL (PF) 2 % IJ SOLN
INTRAMUSCULAR | Status: AC
  Filled 2020-11-26: qty 5

## 2020-11-26 MED ORDER — MISOPROSTOL 25 MCG QUARTER TABLET
ORAL_TABLET | ORAL | Status: AC
  Administered 2020-11-26: 25 ug via ORAL
  Filled 2020-11-26: qty 1

## 2020-11-26 MED ORDER — TERBUTALINE SULFATE 1 MG/ML IJ SOLN
INTRAMUSCULAR | Status: AC
  Administered 2020-11-26: 0.25 mg via SUBCUTANEOUS
  Filled 2020-11-26: qty 1

## 2020-11-26 MED ORDER — NALOXONE HCL 0.4 MG/ML IJ SOLN
0.4000 mg | INTRAMUSCULAR | Status: DC | PRN
  Filled 2020-11-26: qty 1

## 2020-11-26 MED ORDER — DEXAMETHASONE SODIUM PHOSPHATE 10 MG/ML IJ SOLN
INTRAMUSCULAR | Status: DC | PRN
  Administered 2020-11-26: 10 mg via INTRAVENOUS

## 2020-11-26 MED ORDER — SENNOSIDES-DOCUSATE SODIUM 8.6-50 MG PO TABS
2.0000 | ORAL_TABLET | ORAL | Status: DC
  Administered 2020-11-27: 2 via ORAL
  Filled 2020-11-26: qty 2

## 2020-11-26 MED ORDER — OXYTOCIN-SODIUM CHLORIDE 30-0.9 UT/500ML-% IV SOLN
2.5000 [IU]/h | INTRAVENOUS | Status: AC
  Administered 2020-11-26: 2.5 [IU]/h via INTRAVENOUS
  Filled 2020-11-26 (×2): qty 500

## 2020-11-26 MED ORDER — CARBOPROST TROMETHAMINE 250 MCG/ML IM SOLN
INTRAMUSCULAR | Status: AC
  Filled 2020-11-26: qty 1

## 2020-11-26 MED ORDER — SOD CITRATE-CITRIC ACID 500-334 MG/5ML PO SOLN
ORAL | Status: AC
  Filled 2020-11-26: qty 15

## 2020-11-26 MED ORDER — SODIUM CHLORIDE 0.9 % IV SOLN
500.0000 mg | INTRAVENOUS | Status: DC
  Administered 2020-11-26: 500 mg via INTRAVENOUS
  Filled 2020-11-26: qty 500

## 2020-11-26 MED ORDER — PROPOFOL 10 MG/ML IV BOLUS
INTRAVENOUS | Status: DC | PRN
  Administered 2020-11-26: 180 mg via INTRAVENOUS

## 2020-11-26 MED ORDER — POVIDONE-IODINE 10 % EX SWAB: 2.0000 "application " | Freq: Once | CUTANEOUS | Status: DC

## 2020-11-26 MED ORDER — METHYLERGONOVINE MALEATE 0.2 MG/ML IJ SOLN
0.2000 mg | Freq: Once | INTRAMUSCULAR | Status: AC
  Administered 2020-11-26: 0.2 mg via INTRAMUSCULAR
  Filled 2020-11-26: qty 1

## 2020-11-26 MED ORDER — DIPHENHYDRAMINE HCL 25 MG PO CAPS: 25.0000 mg | ORAL_CAPSULE | ORAL | Status: DC | PRN

## 2020-11-26 MED ORDER — PHENYLEPHRINE HCL (PRESSORS) 10 MG/ML IV SOLN
INTRAVENOUS | Status: DC | PRN
  Administered 2020-11-26: 100 ug via INTRAVENOUS

## 2020-11-26 MED ORDER — TERBUTALINE SULFATE 1 MG/ML IJ SOLN
0.2500 mg | Freq: Once | INTRAMUSCULAR | Status: AC
  Filled 2020-11-26: qty 1

## 2020-11-26 MED ORDER — SOD CITRATE-CITRIC ACID 500-334 MG/5ML PO SOLN
ORAL | Status: AC
  Filled 2020-11-26: qty 30

## 2020-11-26 MED ORDER — CITALOPRAM HYDROBROMIDE 20 MG PO TABS
40.0000 mg | ORAL_TABLET | Freq: Every day | ORAL | Status: DC
  Administered 2020-11-27 – 2020-11-28 (×2): 40 mg via ORAL
  Filled 2020-11-26 (×3): qty 2

## 2020-11-26 MED ORDER — ROCURONIUM BROMIDE 10 MG/ML (PF) SYRINGE
PREFILLED_SYRINGE | INTRAVENOUS | Status: AC
  Filled 2020-11-26: qty 10

## 2020-11-26 MED ORDER — FENTANYL CITRATE (PF) 100 MCG/2ML IJ SOLN
INTRAMUSCULAR | Status: AC
  Administered 2020-11-26: 25 ug via INTRAVENOUS
  Filled 2020-11-26: qty 2

## 2020-11-26 MED ORDER — ROCURONIUM BROMIDE 100 MG/10ML IV SOLN
INTRAVENOUS | Status: DC | PRN
  Administered 2020-11-26: 40 mg via INTRAVENOUS

## 2020-11-26 MED ORDER — TRANEXAMIC ACID 1000 MG/10ML IV SOLN
INTRAVENOUS | Status: AC
  Filled 2020-11-26: qty 10

## 2020-11-26 MED ORDER — SODIUM CHLORIDE 0.9 % IV SOLN: INTRAVENOUS | Status: DC | PRN

## 2020-11-26 MED ORDER — AMMONIA AROMATIC IN INHA
RESPIRATORY_TRACT | Status: AC
  Filled 2020-11-26: qty 10

## 2020-11-26 MED ORDER — CHLORHEXIDINE GLUCONATE 0.12 % MT SOLN
15.0000 mL | Freq: Once | OROMUCOSAL | Status: DC
  Filled 2020-11-26 (×2): qty 15

## 2020-11-26 MED ORDER — DIPHENHYDRAMINE HCL 50 MG/ML IJ SOLN: 12.5000 mg | INTRAMUSCULAR | Status: DC | PRN

## 2020-11-26 SURGICAL SUPPLY — 39 items
ADH LQ OCL WTPRF AMP STRL LF (MISCELLANEOUS) ×2
ADHESIVE MASTISOL STRL (MISCELLANEOUS) ×3 IMPLANT
APL PRP STRL LF DISP 70% ISPRP (MISCELLANEOUS) ×4
BAG COUNTER SPONGE EZ (MISCELLANEOUS) ×3 IMPLANT
BAG SPNG 4X4 CLR HAZ (MISCELLANEOUS) ×2
CANISTER SUCT 3000ML PPV (MISCELLANEOUS) ×3 IMPLANT
CHLORAPREP W/TINT 26 (MISCELLANEOUS) ×6 IMPLANT
CLIP FILSHIE TUBAL LIGA STRL (Clip) ×3 IMPLANT
COVER WAND RF STERILE (DRAPES) ×3 IMPLANT
DRESSING TELFA 8X10 (GAUZE/BANDAGES/DRESSINGS) ×3 IMPLANT
DRSG TELFA 3X8 NADH (GAUZE/BANDAGES/DRESSINGS) ×3 IMPLANT
GAUZE SPONGE 4X4 12PLY STRL (GAUZE/BANDAGES/DRESSINGS) ×3 IMPLANT
GAUZE XEROFORM 4X4 STRL (GAUZE/BANDAGES/DRESSINGS) ×12 IMPLANT
GLOVE BIOGEL PI ORTHO PRO 7.5 (GLOVE) ×1
GLOVE PI ORTHO PRO STRL 7.5 (GLOVE) ×2 IMPLANT
GOWN SRG LRG 43XLVL 3 (GOWN DISPOSABLE) ×2 IMPLANT
GOWN STRL NON-REIN LRG LVL3 (GOWN DISPOSABLE) ×3
GOWN STRL REUS W/ TWL LRG LVL3 (GOWN DISPOSABLE) ×4 IMPLANT
GOWN STRL REUS W/TWL LRG LVL3 (GOWN DISPOSABLE) ×6
KIT TURNOVER KIT A (KITS) ×3 IMPLANT
LAPSAC SURG PACK 8X10 (MISCELLANEOUS) ×6
MANIFOLD NEPTUNE II (INSTRUMENTS) ×3 IMPLANT
NS IRRIG 1000ML POUR BTL (IV SOLUTION) ×3 IMPLANT
PACK C SECTION AR (MISCELLANEOUS) ×3 IMPLANT
PACK SURG LAPSAC 8X10 (MISCELLANEOUS) ×4 IMPLANT
PAD OB MATERNITY 4.3X12.25 (PERSONAL CARE ITEMS) ×3 IMPLANT
PAD PREP 24X41 OB/GYN DISP (PERSONAL CARE ITEMS) ×3 IMPLANT
PENCIL SMOKE ULTRAEVAC 22 CON (MISCELLANEOUS) ×3 IMPLANT
RETRACTOR TRAXI PANNICULUS (MISCELLANEOUS) ×2 IMPLANT
RETRACTOR WND ALEXIS-O 25 LRG (MISCELLANEOUS) ×4 IMPLANT
RTRCTR WOUND ALEXIS O 25CM LRG (MISCELLANEOUS) ×6
SET BERKELEY SUCTION TUBING (SUCTIONS) ×3 IMPLANT
SET YANKAUER POOLE SUCT (MISCELLANEOUS) ×3 IMPLANT
SPONGE LAP 18X18 RF (DISPOSABLE) ×3 IMPLANT
SUT VIC AB 0 CTX 36 (SUTURE) ×6
SUT VIC AB 0 CTX36XBRD ANBCTRL (SUTURE) ×4 IMPLANT
SUT VIC AB 1 CT1 36 (SUTURE) ×6 IMPLANT
SUT VICRYL+ 3-0 36IN CT-1 (SUTURE) ×6 IMPLANT
TRAXI PANNICULUS RETRACTOR (MISCELLANEOUS) ×1

## 2020-11-26 NOTE — Consult Note (Signed)
Neonatology Note:   Attendance at C-section:    I was asked by Dr. Amalia Hailey to attend this urgent C/S at term for malposition with placental abruption. The mother is a [id number], GBS neg with good prenatal care complicated by h/o fetal macrosomia, obesity, H/o anxiety and depression (no meds), and abnormal 1 hr, nml 3 GTT. Mother placed under general anesthesia.  ROM 0h 26m prior to delivery, fluid clear. Infant vigorous with good spontaneous cry and tone. +60 sec DCC.  Needed minimal bulb suctioning.   Ap 8/9. Lungs clear to ausc, pink, good perfusion in DR. Father updated.  To CN to care of Pediatrician.  Monia Sabal Katherina Mires, MD

## 2020-11-26 NOTE — Anesthesia Procedure Notes (Addendum)
Procedure Name: Intubation Date/Time: 11/26/2020 2:17 PM Performed by: Hedda Slade, CRNA Pre-anesthesia Checklist: Patient identified, Emergency Drugs available, Suction available and Patient being monitored Patient Re-evaluated:Patient Re-evaluated prior to induction Oxygen Delivery Method: Circle system utilized Preoxygenation: Pre-oxygenation with 100% oxygen Induction Type: IV induction, Rapid sequence and Cricoid Pressure applied Laryngoscope Size: McGraph and 3 Grade View: Grade I Tube type: Oral Tube size: 6.5 mm Number of attempts: 1 Airway Equipment and Method: Stylet Placement Confirmation: ETT inserted through vocal cords under direct vision,  positive ETCO2 and breath sounds checked- equal and bilateral Secured at: 21 cm Tube secured with: Tape Dental Injury: Teeth and Oropharynx as per pre-operative assessment

## 2020-11-26 NOTE — Anesthesia Postprocedure Evaluation (Signed)
Anesthesia Post Note  Patient: Carmen Mathis  Procedure(s) Performed: CESAREAN SECTION (N/A )  Patient location during evaluation: PACU Anesthesia Type: General Level of consciousness: awake and alert Pain management: pain level controlled Vital Signs Assessment: post-procedure vital signs reviewed and stable Respiratory status: spontaneous breathing, nonlabored ventilation, respiratory function stable and patient connected to nasal cannula oxygen Cardiovascular status: blood pressure returned to baseline and stable Postop Assessment: no apparent nausea or vomiting Anesthetic complications: no   No complications documented.   Last Vitals:  Vitals:   11/26/20 1630 11/26/20 1645  BP: 115/69 117/70  Pulse: 77 77  Resp: 18 19  Temp:  37.2 C  SpO2: 93% 95%    Last Pain:  Vitals:   11/26/20 1645  TempSrc:   PainSc: 3                  Precious Haws Jaxyn Rout

## 2020-11-26 NOTE — Transfer of Care (Signed)
Immediate Anesthesia Transfer of Care Note  Patient: Carmen Mathis  Procedure(s) Performed: CESAREAN SECTION (N/A )  Patient Location: PACU  Anesthesia Type:General  Level of Consciousness: awake, alert  and oriented  Airway & Oxygen Therapy: Patient Spontanous Breathing  Post-op Assessment: Report given to RN and Post -op Vital signs reviewed and stable  Post vital signs: Reviewed and stable  Last Vitals:  Vitals Value Taken Time  BP 112/68 11/26/20 1600  Temp    Pulse 96 11/26/20 1601  Resp 15 11/26/20 1601  SpO2 100 % 11/26/20 1601  Vitals shown include unvalidated device data.  Last Pain:  Vitals:   11/26/20 1230  TempSrc: Oral  PainSc: 6       Patients Stated Pain Goal: 0 (72/53/66 4403)  Complications: No complications documented.

## 2020-11-26 NOTE — Progress Notes (Addendum)
MD at bedside to place cytotec. 50 mcg cytotec given vaginally by MD (verified by charge RN and SCC) -49mcg buccal cytotec given per MD. Pt aware of induction plan and has no questions or concerns at this time. FHT 120. Monitors applied and assessing.

## 2020-11-26 NOTE — Anesthesia Preprocedure Evaluation (Signed)
Anesthesia Evaluation  Patient identified by MRN, date of birth, ID band Patient awake    Reviewed: Allergy & Precautions, H&P , NPO status , reviewed documented beta blocker date and time   Airway Mallampati: II  TM Distance: >3 FB Neck ROM: full    Dental  (+) Chipped   Pulmonary    Pulmonary exam normal        Cardiovascular Normal cardiovascular exam     Neuro/Psych PSYCHIATRIC DISORDERS Anxiety Depression  Neuromuscular disease    GI/Hepatic neg GERD  ,Occ GERD, controlled at present   Endo/Other  Morbid obesity  Renal/GU      Musculoskeletal   Abdominal   Peds  Hematology   Anesthesia Other Findings Past Medical History: No date: Depression No date: Dermatofibrosarcoma protuberans of lower extremity, left No date: Skin cancer of nose No date: Trigeminal neuralgia of right side of face  Past Surgical History: No date: no surgical history  BMI    Body Mass Index: 40.05 kg/m      Reproductive/Obstetrics                             Anesthesia Physical Anesthesia Plan  ASA: III  Anesthesia Plan: Spinal   Post-op Pain Management:    Induction: Intravenous  PONV Risk Score and Plan: 3 and Ondansetron and Treatment may vary due to age or medical condition  Airway Management Planned: Nasal Cannula and Natural Airway  Additional Equipment:   Intra-op Plan:   Post-operative Plan:   Informed Consent: I have reviewed the patients History and Physical, chart, labs and discussed the procedure including the risks, benefits and alternatives for the proposed anesthesia with the patient or authorized representative who has indicated his/her understanding and acceptance.     Dental Advisory Given  Plan Discussed with: CRNA  Anesthesia Plan Comments:         Anesthesia Quick Evaluation

## 2020-11-26 NOTE — H&P (Signed)
History and Physical   HPI  Carmen Mathis is a 40 y.o. [id number] at [redacted]w[redacted]d Estimated Date of Delivery: 12/01/20 who is being admitted for induction of labor for elevated BMI and h/o unstable lie. Scheduled this AM for CD for breech but found to be vertex by [place].  Discussed options with pt and we have dcecied to proceed with induction. H/O 5 previous vaginal births - larges 9#15oz.   OB History  OB History  Gravida Para Term Preterm AB Living  9 5 5  0 3 5  SAB IAB Ectopic Multiple Live Births  3 0 0 0 5    # Outcome Date GA Lbr Len/2nd Weight Sex Delivery Anes PTL Lv  9 Current           8 SAB 2013          7 Term 07/05/10   4508 g M Vag-Spont EPI  LIV  6 Term 01/12/08   4224 g F Vag-Spont EPI  LIV  5 Term 02/25/06   4338 g M Vag-Spont Spinal, EPI  LIV  4 Term 07/17/04   4338 g F Vag-Spont EPI, Spinal  LIV  3 Term 09/02/02   3969 g F Vag-Spont EPI, Spinal  LIV  2 SAB 2002          1 SAB 2002            PROBLEM LIST  Pregnancy complications or risks: Patient Active Problem List   Diagnosis Date Noted  . Post-operative state 11/26/2020  . Uterine fibroid complicating antenatal care, baby not yet delivered, second trimester 05/19/2020  . Supervision of high-risk pregnancy of elderly multigravida 05/19/2020  . History of macrosomia in infant in prior pregnancy, currently pregnant 04/21/2020  . Anxiety and depression 06/08/2017    Prenatal labs and studies: ABO, Rh: --/--/B POS, PENDING (02/10 0534) Antibody: NEG (02/10 0534) Rubella: 3.67 (07/16 1442) RPR: Non Reactive (11/16 0937)  HBsAg: Negative (07/16 1442)  HIV: Non Reactive (07/16 1442)  OHY:WVPXTGGY/-- (01/19 1434)   Past Medical History:  Diagnosis Date  . Depression   . Dermatofibrosarcoma protuberans of lower extremity, left   . Skin cancer of nose   . Trigeminal neuralgia of right side of face      Past Surgical History:  Procedure Laterality Date  . no surgical history       Medications     Current Discharge Medication List    CONTINUE these medications which have NOT CHANGED   Details  acetaminophen (TYLENOL) 500 MG tablet Take 500 mg by mouth every 6 (six) hours as needed.    acyclovir ointment (ZOVIRAX) 5 % Apply 1 application topically every 3 (three) hours. Qty: 5 g, Refills: 0    aspirin EC 81 MG tablet Take 1 tablet (81 mg total) by mouth daily. Take after 12 weeks for prevention of preeclampssia later in pregnancy Qty: 300 tablet, Refills: 2    calcium carbonate (TUMS - DOSED IN MG ELEMENTAL CALCIUM) 500 MG chewable tablet Chew 1-2 tablets by mouth 2 (two) times daily as needed for indigestion or heartburn.    Calcium Polycarbophil (FIBER-CAPS PO) Take 1 capsule by mouth every evening.    citalopram (CELEXA) 40 MG tablet Take 1 tablet (40 mg total) by mouth daily. Qty: 90 tablet, Refills: 0    Magnesium 500 MG CAPS Take 500 mg by mouth daily.    Melatonin 10 MG TABS Take 10 mg by mouth at bedtime.    Prenatal Vit-Fe  Fumarate-FA (MULTIVITAMIN-PRENATAL) 27-0.8 MG TABS tablet Take 1 tablet by mouth every evening.    ALPRAZolam (XANAX) 0.25 MG tablet Take 1 tablet (0.25 mg total) by mouth daily as needed for anxiety. Qty: 30 tablet, Refills: 0         Allergies  Ceclor [cefaclor]  Review of Systems  Pertinent items noted in HPI and remainder of comprehensive ROS otherwise negative.  Physical Exam  BP 133/83 (BP Location: Left Arm)   Pulse 79   Temp 98.6 F (37 C) (Oral)   Resp 16   Ht 5\' 7"  (1.702 m)   Wt 116 kg   LMP  (LMP Unknown)   BMI 40.05 kg/m   Lungs:  CTA B Cardio: RRR without M/R/G Abd: Soft, gravid, NT Presentation: cephalic EXT: No C/C/ 1+ Edema DTRs: 2+ B CERVIX: Dilation: Closed Cervical Position: Posterior Station: Ballotable Presentation: Vertex Exam by:: Lawsen Arnott MD  See Prenatal records for more detailed PE.     FHR:  Variability: Good {> 6 bpm)  Toco: Uterine Contractions: None  Test Results  Results for  orders placed or performed during the hospital encounter of 11/26/20 (from the past 24 hour(s))  CBC     Status: Abnormal   Collection Time: 11/26/20  5:34 AM  Result Value Ref Range   WBC 11.3 (H) 4.0 - 10.5 K/uL   RBC 4.32 3.87 - 5.11 MIL/uL   Hemoglobin 10.1 (L) 12.0 - 15.0 g/dL   HCT 33.3 (L) 36.0 - 46.0 %   MCV 77.1 (L) 80.0 - 100.0 fL   MCH 23.4 (L) 26.0 - 34.0 pg   MCHC 30.3 30.0 - 36.0 g/dL   RDW 15.8 (H) 11.5 - 15.5 %   Platelets 255 150 - 400 K/uL   nRBC 0.2 0.0 - 0.2 %  Type and screen     Status: None   Collection Time: 11/26/20  5:34 AM  Result Value Ref Range   ABO/RH(D) B POS    Antibody Screen NEG    Sample Expiration      11/29/2020,2359 Performed at [facility], [address] [place], [place] [address]   Resp Panel by RT-PCR (Flu A&B, Covid) Nasopharyngeal Swab     Status: None   Collection Time: 11/26/20  5:34 AM   Specimen: Nasopharyngeal Swab; Nasopharyngeal(NP) swabs in vial transport medium  Result Value Ref Range   SARS Coronavirus 2 by RT PCR NEGATIVE NEGATIVE   Influenza A by PCR NEGATIVE NEGATIVE   Influenza B by PCR NEGATIVE NEGATIVE  ABO/Rh     Status: None (Preliminary result)   Collection Time: 11/26/20  5:34 AM  Result Value Ref Range   ABO/RH(D) PENDING      Assessment   [id number] at [redacted]w[redacted]d Estimated Date of Delivery: 12/01/20  The fetus is reassuring.   Patient Active Problem List   Diagnosis Date Noted  . Post-operative state 11/26/2020  . Uterine fibroid complicating antenatal care, baby not yet delivered, second trimester 05/19/2020  . Supervision of high-risk pregnancy of elderly multigravida 05/19/2020  . History of macrosomia in infant in prior pregnancy, currently pregnant 04/21/2020  . Anxiety and depression 06/08/2017    Plan  1. Admit to L&D :   2. EFM: -- Category 1 3. Stadol or Epidural if desired.   4. Admission labs  5. Misoprostol given - expect vaginal delivery  Finis Bud, M.D. 11/26/2020 8:16  AM

## 2020-11-26 NOTE — Op Note (Signed)
OP NOTE  Date: 11/26/2020   4:36 PM Name Carmen Mathis MR# [id number]  Preoperative Diagnosis: 1. Intrauterine pregnancy at [redacted]w[redacted]d 2. Desires Permanent Sterilization 3.  Fetal malpresentation 4.  Placental abruption  Postoperative Diagnosis: 1. Intrauterine pregnancy at [redacted]w[redacted]d, delivered 2. Desires Permanent Sterilization 3. Viable infant 4. Remainder same as pre-op  Procedure: 1.  Primary Low-Transverse Cesarean Section 2. Bilateral Tubal Occlusion  Surgeon: Finis Bud, MD  Assistant: Marcelline Mates, MD No other capable assistant was available for this surgery which requires an experienced, high level assistant.  She provided exposure, dissection, suctioning, retraction, and general support and assistance during the procedure.   Anesthesia: General   EBL: 2055  ml    Findings: 1) female infant, Apgar scores of 8   at 1 minute and 9   at 5 minutes and a birthweight of 142.15  ounces.    2) Normal uterus, tubes and ovaries.   Preop narrative: Patient presented for cesarean delivery this morning for breech presentation.  An ultrasound revealed vertex presentation.  Patient elected to have induction for elevated BMI at 39 weeks and 2 days.  Patient received misoprostol intravaginally and she progressed well with contractions every 2 minutes to approximately 6 cm dilation.  I performed a vaginal examination but could not feel the presenting part.  An ultrasound was performed again revealing direct midline vertex.  I discussed artificial rupture membranes with the patient and thought it was best not to perform this because of the risk of cord prolapse without a palpable presenting part.  Patient stated that this had happened to her before when he had was very high in the pelvis and did not descend until immediately prior to delivery.  Approximately 10 minutes later the patient was in the bathroom underwent spontaneous rupture of membranes while on the toilet and it was immediately  checked by the nursing staff.  They could not feel a vertex presenting part.  My examination revealed shoulder presentation with the vertex in the right lower quadrant just off center.  Patient declined any attempt at manipulation and elected to have a cesarean delivery.  We immediately called anesthesia.  Approximately 5 minutes later patient suddenly felt a gush was noted to be sitting in a puddle of blood.  We converted the urgent cesarean delivery to a stat cesarean delivery and proceeded as below.  Procedure:   The patient was prepped and draped in the supine position and placed under general anesthesia.  A transverse incision was made across the abdomen in a Pfannenstiel manner. We carried the dissection down to the level of the fascia.  The fascia was incised in a curvilinear manner.  The fascia was then elevated from the rectus muscles with blunt and sharp dissection.  The rectus muscles were separated laterally exposing the peritoneum.  The peritoneum was carefully entered with care being taken to avoid bowel and bladder. The visceral peritoneum was incised in a curvilinear fashion across the lower uterine segment creating a bladder flap.  Bladder blade was placed.  A transverse incision was made across the lower uterine segment and extended laterally and superiorly.  Blood and blood clots were produced from the cesarean uterine incision.  The infant was delivered from the obvious shoulder presentation position.  Immediately upon delivery the infant was noted to be vigorous with a heart rate greater than 90.  Delayed cord clamping was performed.  The cord was doubly clamped and cut.   The infant was handed to  the pediatric personnel  who then placed the infant under heat lamps where it was cleaned dried and re-suctioned. The placenta was delivered. The hysterotomy incision was then identified on ring forceps.  The uterus remained boggy and the entire uterine incision was noted to have active bleeding  from numerous large vessels.  These were systematically clamped using ring forceps.  Pitocin was run in the IV and Methergine was also given.  The uterus eventually firmed up.  The uterine cavity was cleaned with a moist lap sponge.  The hysterotomy incision was closed with a running interlocking suture of Vicryl.  Several additional sutures were placed in a running interlocking manner to provide hemostasis.  Immediately after delivery of the infant were bleeding was attics maximum with both the blood loss from intrauterine extravasation and active bleeding from a boggy uterus the massive bleeding/hemorrhage protocol was initiated.  The patient received 2 units of packed red cells during the procedure.  Once bleeding was under control and the uterus closed we proceeded to the tubal ligation. The fallopian tubes were identified  and followed out to their fine fimbriated ends and then back to the mid-portion of the tube which was elevated on a babcock clamp.  Both tubes were completely occluded using Filshie clips in a perpendicular manner.  Hemostasis was noted. The posterior cul-de-sac and gutters were cleaned and inspected.  Hemostasis was noted.  The fascia was then closed with a running suture of #1 Vicryl.  Hemostasis of the subcutaneous tissues was obtained using the Bovie.  The subcutaneous tissues were closed with a running suture of 000 Vicryl.  A subcuticular suture was placed.  Steri-Strips were applied in the usual manner.  A pressure dressing was placed.  The patient went to the recovery room in stable condition.   Finis Bud, M.D. 11/26/2020 4:36 PM

## 2020-11-27 ENCOUNTER — Encounter: Payer: Self-pay | Admitting: Obstetrics and Gynecology

## 2020-11-27 LAB — PREPARE PLATELET PHERESIS: Unit division: 0

## 2020-11-27 LAB — MASSIVE TRANSFUSION PROTOCOL ORDER (BLOOD BANK NOTIFICATION)

## 2020-11-27 LAB — CBC WITH DIFFERENTIAL/PLATELET
Abs Immature Granulocytes: 0.33 10*3/uL — ABNORMAL HIGH (ref 0.00–0.07)
Basophils Absolute: 0 10*3/uL (ref 0.0–0.1)
Basophils Relative: 0 %
Eosinophils Absolute: 0 10*3/uL (ref 0.0–0.5)
Eosinophils Relative: 0 %
HCT: 22.3 % — ABNORMAL LOW (ref 36.0–46.0)
Hemoglobin: 7.2 g/dL — ABNORMAL LOW (ref 12.0–15.0)
Immature Granulocytes: 2 %
Lymphocytes Relative: 6 %
Lymphs Abs: 1.1 10*3/uL (ref 0.7–4.0)
MCH: 25.9 pg — ABNORMAL LOW (ref 26.0–34.0)
MCHC: 32.3 g/dL (ref 30.0–36.0)
MCV: 80.2 fL (ref 80.0–100.0)
Monocytes Absolute: 1.3 10*3/uL — ABNORMAL HIGH (ref 0.1–1.0)
Monocytes Relative: 6 %
Neutro Abs: 17.2 10*3/uL — ABNORMAL HIGH (ref 1.7–7.7)
Neutrophils Relative %: 86 %
Platelets: 196 10*3/uL (ref 150–400)
RBC: 2.78 MIL/uL — ABNORMAL LOW (ref 3.87–5.11)
RDW: 17.5 % — ABNORMAL HIGH (ref 11.5–15.5)
WBC: 20 10*3/uL — ABNORMAL HIGH (ref 4.0–10.5)
nRBC: 0 % (ref 0.0–0.2)

## 2020-11-27 LAB — BPAM PLATELET PHERESIS
Blood Product Expiration Date: 202202112359
ISSUE DATE / TIME: 202202101436
Unit Type and Rh: 7300

## 2020-11-27 LAB — ABO/RH
ABO/RH(D): B POS
ABO/RH(D): B POS

## 2020-11-27 LAB — RPR: RPR Ser Ql: NONREACTIVE

## 2020-11-27 MED ORDER — LACTATED RINGERS IV BOLUS
500.0000 mL | Freq: Once | INTRAVENOUS | Status: AC
  Administered 2020-11-27: 500 mL via INTRAVENOUS

## 2020-11-27 MED ORDER — SODIUM CHLORIDE 0.9% IV SOLUTION: Freq: Once | INTRAVENOUS | Status: AC

## 2020-11-27 NOTE — Progress Notes (Signed)
Pt c/o R lateral chest pains and SOB when lying flat. Symptoms resolved with 2L O2 [place] applied and pt repositioned to semi-fowlers. VSS. MD notified and received orders for H&H x 1. Results populated and MD made aware. Pt's symptoms resolved completely and no new orders r/t h&h results. Urine output is low this am. Hgb 7.2. MD notified. Received orders for 500cc bolus x 1.

## 2020-11-27 NOTE — Lactation Note (Addendum)
This note was copied from a baby's chart. Lactation Consultation Note  Patient Name: Carmen Mathis [id number] Date: 11/27/2020 Reason for consult: Initial assessment;1st time breastfeeding;Term Age:42 hours  Maternal Data Does the patient have breastfeeding experience prior to this delivery?: Yes How long did the patient breastfeed?: 9 mths Mom desires to pump and bottlefeed, states she had low supply and latch issues with other children, last child is 40 yrs old, mom set up to pump with symphony pump, pumped in initiation mode and obtained drops of colostrum in flange of shields, equipment washed with palmolive soap and allowed to air dry.    Feeding Mother's Current Feeding Choice: Breast Milk and Formula Nipple Type: Slow - flow After pumping baby was showing feeding cues, mom decided to attempt breastfeeding as she had wanted to try post delivery but was unable to, baby latched easily in cradle hold and nursed on both breasts x 40 min with occ. Swallows noted by mom.   LATCH Score Latch: Grasps breast easily, tongue down, lips flanged, rhythmical sucking.  Audible Swallowing: A few with stimulation  Type of Nipple: Everted at rest and after stimulation  Comfort (Breast/Nipple): Soft / non-tender  Hold (Positioning): Assistance needed to correctly position infant at breast and maintain latch.  LATCH Score: 8   Lactation Tools Discussed/Used  Mom encouraged to pump breasts every 3 hrs or at least 8x/24hrs, [place] name and no written on white board     Interventions Interventions: Breast feeding basics reviewed;Assisted with latch;Skin to skin;Pre-pump if needed;Adjust position;Support pillows;DEBP  Discharge Pump: DEBP;Personal WIC Program: No  Consult Status Consult Status: PRN    Ferol Luz 11/27/2020, 12:45 PM

## 2020-11-27 NOTE — Progress Notes (Signed)
Progress Note - Cesarean Delivery  Carmen Mathis is a 40 y.o. [phone] now PP day 1 s/p C-Section, Low Transverse .   Subjective:  Patient reports no problems with eating, bowel movements, voiding, or their wound  Objective:  Vital signs in last 24 hours: Temp:  [97.3 F (36.3 C)-98.9 F (37.2 C)] 97.8 F (36.6 C) (02/11 0308) Pulse Rate:  [65-92] 65 (02/11 0610) Resp:  [11-24] 20 (02/11 0610) BP: (101-132)/(48-77) 115/56 (02/11 0610) SpO2:  [93 %-100 %] 97 % (02/11 0610)  Physical Exam:  General: alert, cooperative and no distress Lochia: appropriate Uterine Fundus: firm Incision: dressing intact Urine output excellent this morning    Data Review Recent Labs    11/26/20 2304 11/27/20 0259  HGB 7.9* 7.2*  HCT 24.3* 22.3*    Assessment:  Active Problems:   Post-operative state   Status post Cesarean section. Doing well postoperatively.   Low H&H - 7.2 Hgb  Plan:       Will transfuse as I expect Hgb will continue to fall as pt re-equilibrates. Discussed transfusion with patient and she agrees with transfusion.      Finis Bud, M.D. 11/27/2020 8:34 AM

## 2020-11-28 DIAGNOSIS — O329XX0 Maternal care for malpresentation of fetus, unspecified, not applicable or unspecified: Secondary | ICD-10-CM

## 2020-11-28 DIAGNOSIS — O4590 Premature separation of placenta, unspecified, unspecified trimester: Secondary | ICD-10-CM

## 2020-11-28 DIAGNOSIS — D62 Acute posthemorrhagic anemia: Secondary | ICD-10-CM

## 2020-11-28 LAB — CBC WITH DIFFERENTIAL/PLATELET
Abs Immature Granulocytes: 0.27 10*3/uL — ABNORMAL HIGH (ref 0.00–0.07)
Basophils Absolute: 0.1 10*3/uL (ref 0.0–0.1)
Basophils Relative: 0 %
Eosinophils Absolute: 0.2 10*3/uL (ref 0.0–0.5)
Eosinophils Relative: 1 %
HCT: 21.3 % — ABNORMAL LOW (ref 36.0–46.0)
Hemoglobin: 7.1 g/dL — ABNORMAL LOW (ref 12.0–15.0)
Immature Granulocytes: 2 %
Lymphocytes Relative: 12 %
Lymphs Abs: 1.4 10*3/uL (ref 0.7–4.0)
MCH: 27.4 pg (ref 26.0–34.0)
MCHC: 33.3 g/dL (ref 30.0–36.0)
MCV: 82.2 fL (ref 80.0–100.0)
Monocytes Absolute: 0.8 10*3/uL (ref 0.1–1.0)
Monocytes Relative: 6 %
Neutro Abs: 9.3 10*3/uL — ABNORMAL HIGH (ref 1.7–7.7)
Neutrophils Relative %: 79 %
Platelets: 166 10*3/uL (ref 150–400)
RBC: 2.59 MIL/uL — ABNORMAL LOW (ref 3.87–5.11)
RDW: 17.2 % — ABNORMAL HIGH (ref 11.5–15.5)
WBC: 12 10*3/uL — ABNORMAL HIGH (ref 4.0–10.5)
nRBC: 0 % (ref 0.0–0.2)

## 2020-11-28 MED ORDER — IBUPROFEN 800 MG PO TABS: 800.0000 mg | ORAL_TABLET | Freq: Three times a day (TID) | ORAL | 0 refills | Status: DC

## 2020-11-28 MED ORDER — OXYCODONE-ACETAMINOPHEN 5-325 MG PO TABS: 1.0000 | ORAL_TABLET | ORAL | 0 refills | Status: AC | PRN

## 2020-11-28 MED ORDER — IBUPROFEN 800 MG PO TABS
800.0000 mg | ORAL_TABLET | Freq: Three times a day (TID) | ORAL | Status: DC
  Administered 2020-11-28: 800 mg via ORAL
  Filled 2020-11-28: qty 1

## 2020-11-28 NOTE — Discharge Summary (Signed)
Physician Obstetric Discharge Summary  Patient Name: Carmen Mathis DOB: 07-02-1981 MRN: [id number]                            Discharge Summary  Date of Admission: 11/26/2020 Date of Discharge: 2/12/2022livering Provider: Harlin Heys   Admitting Diagnosis: Post-operative state [Z98.890] at [redacted]w[redacted]d Secondary diagnosis:  Active Problems:   Post-operative state   Acute blood loss as cause of postoperative anemia   Malpresentation of fetus, delivered   Antepartum placental abruption  Mode of Delivery:       low uterine, transverse     Discharge diagnosis: Term Pregnancy Delivered and Anemia      Intrapartum Procedures: tubal ligation   Post partum procedures: blood transfusion                     Discharge Day SOAP Note:  Subjective:  The patient has no complaints.  She is ambulating well. She is taking PO well. Pain is well controlled with current medications. Patient is urinating without difficulty.   She is passing flatus.  She is aware of her anemia, but is asymptomatic and desires discharge.  Objective  Vital signs: BP 117/63 (BP Location: Right Arm)   Pulse 75   Temp 98.5 F (36.9 C) (Oral)   Resp 20   Ht 5\' 7"  (1.702 m)   Wt 116 kg   LMP  (LMP Unknown)   SpO2 98%   Breastfeeding Unknown   BMI 40.05 kg/m   Physical Exam: Gen: NAD Abdomen:  clean, dry, no drainage Fundus Fundal Tone: Firm  Lochia Amount: Small     Data Review Labs: Lab Results  Component Value Date   WBC 12.0 (H) 11/28/2020   HGB 7.1 (L) 11/28/2020   HCT 21.3 (L) 11/28/2020   MCV 82.2 11/28/2020   PLT 166 11/28/2020   CBC Latest Ref Rng & Units 11/28/2020 11/27/2020 11/26/2020  WBC 4.0 - 10.5 K/uL 12.0(H) 20.0(H) -  Hemoglobin 12.0 - 15.0 g/dL 7.1(L) 7.2(L) 7.9(L)  Hematocrit 36.0 - 46.0 % 21.3(L) 22.3(L) 24.3(L)  Platelets 150 - 400 K/uL 166 196 -   B POS Performed at [facility], [address] [place], [place] [address]   Flavia Shipper Score: Flavia Shipper  Postnatal Depression Scale Screening Tool 11/27/2020  I have been able to laugh and see the funny side of things. 0  I have looked forward with enjoyment to things. 0  I have blamed myself unnecessarily when things went wrong. 0  I have been anxious or worried for no good reason. 1  I have felt scared or panicky for no good reason. 1  Things have been getting on top of me. 0  I have been so unhappy that I have had difficulty sleeping. 0  I have felt sad or miserable. 0  I have been so unhappy that I have been crying. 0  The thought of harming myself has occurred to me. 0  Edinburgh Postnatal Depression Scale Total 2    Assessment:  Active Problems:   Post-operative state   Acute blood loss as cause of postoperative anemia   Malpresentation of fetus, delivered   Antepartum placental abruption   Doing well.  Normal progress as expected. Asymptomatic anemia Plan:  Discharge to home  Modified rest as directed - may slowly resume normal activities with restrictions  as discussed.  Medications as written.  Iron BID  Discharge Instructions: Per After Visit Summary. Activity: Advance as tolerated. Pelvic rest for 6 weeks.  Also refer to After Visit Summary.  Wound care discussed. Diet: Regular Medications: Allergies as of 11/28/2020      Reactions   Ceclor [cefaclor] Hives      Medication List    STOP taking these medications   acyclovir ointment 5 % Commonly known as: Zovirax   ALPRAZolam 0.25 MG tablet Commonly known as: XANAX   aspirin EC 81 MG tablet   calcium carbonate 500 MG chewable tablet Commonly known as: TUMS - dosed in mg elemental calcium   FIBER-CAPS PO   Magnesium 500 MG Caps   Melatonin 10 MG Tabs     TAKE these medications   acetaminophen 500 MG tablet Commonly known as: TYLENOL Take 500 mg by mouth every 6 (six) hours as needed.   citalopram 40 MG tablet Commonly known as: CELEXA Take 1 tablet (40 mg total) by mouth daily. What changed:  when to take this   ibuprofen 800 MG tablet Commonly known as: ADVIL Take 1 tablet (800 mg total) by mouth every 8 (eight) hours.   multivitamin-prenatal 27-0.8 MG Tabs tablet Take 1 tablet by mouth every evening.   oxyCODONE-acetaminophen 5-325 MG tablet Commonly known as: PERCOCET/ROXICET Take 1-2 tablets by mouth every 4 (four) hours as needed for moderate pain.      Outpatient follow up:   Follow-up Information    Harlin Heys, MD Follow up in 1 week(s).   Specialties: Obstetrics and Gynecology, Radiology Contact information: [address] [place] [address] [phone]              Postpartum contraception: Will discuss at first post-partum visit.  Discharged Condition: good  Discharged to: home  Newborn Data: Disposition:home with mother  Apgars: APGAR (1 MIN): 8   APGAR (5 MINS): 9   APGAR (10 MINS):    Baby Feeding: Breast  Finis Bud, M.D. 11/28/2020 9:38 AM

## 2020-11-28 NOTE — Progress Notes (Signed)
Mother discharged.  Discharge instructions given.  Mother verbalizes understanding.  Transported by auxiliary.

## 2020-11-28 NOTE — Discharge Instructions (Signed)
Discharge Instructions:   Follow-up Appointment:  If there are any new medications, they have been ordered and will be available for pickup at the listed pharmacy on your way home from the hospital.   Call office if you have any of the following: headache, visual changes, fever >101.0 F, chills, shortness of breath, breast concerns, excessive vaginal bleeding, incision drainage or problems, leg pain or redness, depression or any other concerns. If you have vaginal discharge with an odor, let your doctor know.   It is normal to bleed for up to 6 weeks. You should not soak through more than 1 pad in 1 hour. If you have a blood clot larger than your fist with continued bleeding, call your doctor.   Activity: Do not lift > 10 lbs for 6 weeks (do not lift anything heavier than your baby). No intercourse, tampons, swimming pools, hot tubs, baths (only showers) for 6 weeks.  No driving for 1-2 weeks. Continue prenatal vitamin, especially if breastfeeding. Increase calories and fluids (water) while breastfeeding.   Your milk will come in, in the next couple of days (right now it is colostrum). You may have a slight fever when your milk comes in, but it should go away on its own.  If it does not, and rises above 101 F please call the doctor. You will also feel achy and your breasts will be firm. They will also start to leak. If you are breastfeeding, continue as you have been and you can pump/express milk for comfort.   If you have too much milk, your breasts can become engorged, which could lead to mastitis. This is an infection of the milk ducts. It can be very painful and you will need to notify your doctor to obtain a prescription for antibiotics. You can also treat it with a shower or hot/cold compress.   For concerns about your baby, please call your pediatrician.  For breastfeeding concerns, the lactation consultant can be reached at [phone].   Postpartum blues (feelings of happy one minute  and sad another minute) are normal for the first few weeks but if it gets worse let your doctor know.   Congratulations! We enjoyed caring for you and your new bundle of joy!   Cesarean Delivery, Care After This sheet gives you information about how to care for yourself after your procedure. Your health care provider may also give you more specific instructions. If you have problems or questions, contact your health care provider. What can I expect after the procedure? After the procedure, it is common to have:  A small amount of blood or clear fluid coming from the incision.  Some redness, swelling, and pain in your incision area.  Some abdominal pain and soreness.  Vaginal bleeding (lochia). Even though you did not have a vaginal delivery, you will still have vaginal bleeding and discharge.  Pelvic cramps.  Fatigue. You may have pain, swelling, and discomfort in the tissue between your vagina and your anus (perineum) if:  Your C-section was unplanned, and you were allowed to labor and push.  An incision was made in the area (episiotomy) or the tissue tore during attempted vaginal delivery. Follow these instructions at home: Incision care  Follow instructions from your health care provider about how to take care of your incision. Make sure you: ? Wash your hands with soap and water before you change your bandage (dressing). If soap and water are not available, use hand sanitizer. ? If you have a dressing,  change it or remove it as told by your health care provider. ? Leave stitches (sutures), skin staples, skin glue, or adhesive strips in place. These skin closures may need to stay in place for 2 weeks or longer. If adhesive strip edges start to loosen and curl up, you may trim the loose edges. Do not remove adhesive strips completely unless your health care provider tells you to do that.  Check your incision area every day for signs of infection. Check for: ? More redness,  swelling, or pain. ? More fluid or blood. ? Warmth. ? Pus or a bad smell.  Do not take baths, swim, or use a hot tub until your health care provider says it's okay. Ask your health care provider if you can take showers.  When you cough or sneeze, hug a pillow. This helps with pain and decreases the chance of your incision opening up (dehiscing). Do this until your incision heals.   Medicines  Take over-the-counter and prescription medicines only as told by your health care provider.  If you were prescribed an antibiotic medicine, take it as told by your health care provider. Do not stop taking the antibiotic even if you start to feel better.  Do not drive or use heavy machinery while taking prescription pain medicine. Lifestyle  Do not drink alcohol. This is especially important if you are breastfeeding or taking pain medicine.  Do not use any products that contain nicotine or tobacco, such as cigarettes, e-cigarettes, and chewing tobacco. If you need help quitting, ask your health care provider. Eating and drinking  Drink at least 8 eight-ounce glasses of water every day unless told not to by your health care provider. If you breastfeed, you may need to drink even more water.  Eat high-fiber foods every day. These foods may help prevent or relieve constipation. High-fiber foods include: ? Whole grain cereals and breads. ? Brown rice. ? Beans. ? Fresh fruits and vegetables. Activity  If possible, have someone help you care for your baby and help with household activities for at least a few days after you leave the hospital.  Return to your normal activities as told by your health care provider. Ask your health care provider what activities are safe for you.  Rest as much as possible. Try to rest or take a nap while your baby is sleeping.  Do not lift anything that is heavier than 10 lbs (4.5 kg), or the limit that you were told, until your health care provider says that it is  safe.  Talk with your health care provider about when you can engage in sexual activity. This may depend on your: ? Risk of infection. ? How fast you heal. ? Comfort and desire to engage in sexual activity.   General instructions  Do not use tampons or douches until your health care provider approves.  Wear loose, comfortable clothing and a supportive and well-fitting bra.  Keep your perineum clean and dry. Wipe from front to back when you use the toilet.  If you pass a blood clot, save it and call your health care provider to discuss. Do not flush blood clots down the toilet before you get instructions from your health care provider.  Keep all follow-up visits for you and your baby as told by your health care provider. This is important. Contact a health care provider if:  You have: ? A fever. ? Bad-smelling vaginal discharge. ? Pus or a bad smell coming from your incision. ?  Difficulty or pain when urinating. ? A sudden increase or decrease in the frequency of your bowel movements. ? More redness, swelling, or pain around your incision. ? More fluid or blood coming from your incision. ? A rash. ? Nausea. ? Little or no interest in activities you used to enjoy. ? Questions about caring for yourself or your baby.  Your incision feels warm to the touch.  Your breasts turn red or become painful or hard.  You feel unusually sad or worried.  You vomit.  You pass a blood clot from your vagina.  You urinate more than usual.  You are dizzy or light-headed. Get help right away if:  You have: ? Pain that does not go away or get better with medicine. ? Chest pain. ? Difficulty breathing. ? Blurred vision or spots in your vision. ? Thoughts about hurting yourself or your baby. ? New pain in your abdomen or in one of your legs. ? A severe headache.  You faint.  You bleed from your vagina so much that you fill more than one sanitary pad in one hour. Bleeding should not be  heavier than your heaviest period. Summary  After the procedure, it is common to have pain at your incision site, abdominal cramping, and slight bleeding from your vagina.  Check your incision area every day for signs of infection.  Tell your health care provider about any unusual symptoms.  Keep all follow-up visits for you and your baby as told by your health care provider. This information is not intended to replace advice given to you by your health care provider. Make sure you discuss any questions you have with your health care provider. Document Revised: 04/11/2018 Document Reviewed: 04/11/2018 Elsevier Patient Education  [place] Depression When a woman feels excessive sadness, anger, or anxiety during pregnancy or during the first 12 months after she gives birth, she has a condition called perinatal depression. This can interfere with work, school, relationships, and other everyday activities. If it is not managed properly, it can also interfere with the woman's ability to take care of the baby. Symptoms of perinatal depression may feel worse when living with a newborn. Sometimes, these symptoms are left untreated because they are thought to be normal mood swings during and right after pregnancy. However, if you have intense symptoms of depression that last for more than 2 weeks, it is important to talk with your health care provider. This may be perinatal depression. What are the causes? The exact cause of this condition is not known. Hormonal changes during and after pregnancy may play a role in causing perinatal depression. What increases the risk? You are more likely to develop this condition if:  You have a personal or family history of depression, anxiety, or mood disorders.  You experience a stressful life event during pregnancy, such as the death of a loved one.  You have additional life stress, such as being a single parent.  You do not have  support from family members or loved ones, or you are in an abusive relationship.  You have thyroid problems. What are the signs or symptoms? Symptoms of this condition include:  Emotional symptoms, such as: ? Feeling sad or hopeless. ? Feelings of guilt. ? Feeling irritable or overwhelmed.  Physical symptoms, such as: ? Changes with appetite or sleep. ? Lack of energy or motivation. ? Persistent headaches or stomach problems.  Behavioral symptoms, such as: ? Difficulty concentrating or completing tasks. ?  Loss of interest in hobbies or relationships. How is this diagnosed? This condition is diagnosed based on a physical exam and mental evaluation.  In some cases, your health care provider may use a depression screening tool. This includes a list of questions that can help a health care provider diagnose depression.  You may be referred to a mental health expert who specializes in treating perinatal depression. How is this treated? This condition may be treated with:  Talk therapy with a mental health professional. This may be interpersonal psychotherapy, couples therapy, cognitive behavioral therapy, or mother-child bonding therapy.  Medicines. Your health care provider will discuss the safety of the medicines prescribed during pregnancy and breastfeeding.  Support groups.  Brain stimulation or light therapies.  Stress reduction therapies, such as mindfulness.   Follow these instructions at home: Lifestyle  Do not use any products that contain nicotine or tobacco. These products include cigarettes, chewing tobacco, and vaping devices, such as e-cigarettes. If you need help quitting, ask your health care provider.  Do not drink alcohol when you are pregnant. It is also safest not to drink alcohol if you are breastfeeding.  After your baby is born, if you drink alcohol: ? Limit how much you have to 0-1 drink a day. ? Be aware of how much alcohol is in your drink. In the  [place] one drink equals one 12 oz bottle of beer (355 mL), one 5 oz glass of wine (148 mL), or one 1 oz glass of hard liquor (44 mL).  Consider joining a support group for new mothers. Ask your health care provider for recommendations.  Take good care of yourself. Make sure you: ? Get as much sleep as possible. Talk with your partner about sharing the responsibility of getting up with your baby if possible and sharing child care responsibilities equally. Make sleep a priority. ? Eat a healthy diet. This includes plenty of fruits and vegetables, whole grains, and lean proteins. ? Exercise regularly, as told by your health care provider. Ask your health care provider what exercises are safe for you. Talk with your partner about making sure you both have opportunities to exercise. General instructions  Take over-the-counter and prescription medicines only as told by your health care provider.  Talk with your partner or family members about your feelings during pregnancy. Share any concerns, needs, or anxieties that you may have. Do not be afraid to ask for help. Find a mental health professional, if needed.  Ask for help with tasks or chores when you need it. Ask friends and family members to provide meals, watch your children, or help with cleaning.  Keep all follow-up visits. This is important. Contact a health care provider if:  You or people close to you notice that you have symptoms of depression.  Your symptoms of depression get worse.  You take medicines and have side effects, such as nausea or sleep problems. Get help right away if:  You feel like hurting yourself, your baby, or someone else. If you feel like you may hurt yourself or others, or have thoughts about taking your own life, get help right away. You can go to your nearest emergency department or:  Call your local emergency services (911 in the [place]  Call a suicide crisis helpline, such as the [place], at 314-197-8404. This is open 24 hours a day in the [place]  Text the Crisis Text Line at (386)057-2977 (in the [place].). Summary  Perinatal depression is when  a woman feels excessive sadness, anger, or anxiety during pregnancy or during the first 12 months after she gives birth.  If perinatal depression is not managed properly, it can interfere with the woman's ability to take care of the baby.  This condition is treated with medicines, talk therapy, stress reduction therapies, or a combination of treatments.  Talk with your partner or family members about your feelings. Ask for help when you need it. This information is not intended to replace advice given to you by your health care provider. Make sure you discuss any questions you have with your health care provider. Document Revised: 03/27/2020 Document Reviewed: 03/27/2020 Elsevier Patient Education  2021 [employer].   Breast Pumping Tips Breast pumping is a way to get milk out of your breasts. You will then store the milk for your baby to use when you are away from home. There are three ways to pump.  You can use your hand to massage and squeeze your breast (hand expression).  You can use a hand-held machine to manually pump your milk.  You can use an electric machine to pump your milk. In the beginning you may not get much milk. After a few days, your breasts should make more. Pumping can help you start making milk after your baby is born. Pumping helps you to keep making milk when you are away from your baby. When should I pump? You can start pumping soon after your baby is born. Follow these tips:  When you are with your baby: ? Pump after you breastfeed. ? Pump from the free breast while you breastfeed.  When you are away from your baby: ? Pump every 2-3 hours for 15 minutes. ? Pump both breasts at the same time if you can.  If your baby drinks formula, pump around the time your baby gets the formula.  If  you drank alcohol, wait 2 hours before you pump.  If you are going to have surgery, ask your doctor when you should pump again. How do I get ready to pump? Try to relax. Try these things to help your milk come in:  Smell your baby's blanket or clothes.  Look at a picture or video of your baby.  Sit in a quiet, private space.  Place a cloth on your breast. The cloth should be warm and a little wet.  Massage your breast and nipple.  Play relaxing music.  Picture your milk flowing.  Drink water and eat a snack. What are some tips? General tips for pumping breast milk  Always wash your hands with soap and water for at least 20 seconds before pumping.  If you do not get much milk or if pumping hurts, try different pump settings or a different kind of pump.  Drink enough fluid so your pee (urine) is clear or pale yellow.  Wear clothing that opens in the front or is easy to take off.  Pump milk into a clean bottle or container.  Do not smoke or use any products that contain nicotine or tobacco. If you need help quitting, ask your doctor.  Try to get a hands-free pumping bra, if possible. This makes it easy to pump breast milk. You can buy one or make your own.   Tips for storing breast milk  Store breast milk in a clean, BPA-free container. These include: ? A glass or plastic bottle. ? A milk storage bag.  Store only 2-4 ounces of breast milk in each container.  Swirl the breast milk in the container. Do not shake it.  Write down the date you pumped the milk on the container.  This is how long you can store breast milk: ? Room temperature: 6-8 hours. It is best to use the milk within 4 hours. ? Cooler with ice packs: 24 hours. ? Refrigerator: 5-8 days, if the milk is clean. It is best to use the milk within 3 days. ? Freezer: 9-12 months, if the milk is clean and stored away from the freezer door. It is best to use the milk within 6 months.  Put milk in the back of the  refrigerator or freezer.  Thaw frozen milk using warm water. Do not use the microwave.   Tips for choosing a breast pump When choosing a pump, keep the following things in mind:  Manual breast pumps do not need electricity. They cost less. They can be hard to use.  Electric breast pumps use electricity. They are more expensive. They are easier to use. They collect more milk.  The suction cup (flange) should be the right size.  Before you buy the pump, check if your insurance will pay for it. Tips for caring for a breast pump  Check the manual that came with your pump for cleaning tips.  Try not to touch the inside of pump parts.  Clean the pump after you use it. To do this: ? Wipe down the electrical part. Use a dry cloth or paper towel. Do not put this part in water or in cleaning products. ? Wash the plastic parts with soap and warm water. Or use the dishwasher if the manual says it is safe. You do not need to clean the tubing unless it touched breast milk. ? Let all the parts air dry. Avoid drying them with a cloth or towel. ? When the parts are clean and dry, put the pump back together. Then store the pump.  If there is water in the tubing when you want to pump: 1. Attach the tubing to the pump. 2. Turn on the pump to dry the tubing. 3. Turn off the pump when the tube is dry. Summary  Pumping can help you start making milk after your baby is born. It lets you keep making milk when you are away from your baby.  When you are away from your baby, pump for about 15 minutes every 2-3 hours. Pump both breasts at the same time, if you can. This information is not intended to replace advice given to you by your health care provider. Make sure you discuss any questions you have with your health care provider. Document Revised: 07/14/2020 Document Reviewed: 07/14/2020 Elsevier Patient Education  2021 [employer].

## 2020-11-28 NOTE — Progress Notes (Signed)
Pt SROM while sitting on toilet -pt escorted back to bed and cervix checked by 2 RNs-unable to determine presenting part. MD called to bedside. Bedside US performed. Urgent C-Section called. Pt began to have vaginal bleeding- changed to STAT C-Section. MD at bedside.  After surgery, pt taken to PACU for recovery.

## 2020-11-29 LAB — BPAM RBC
Blood Product Expiration Date: 202203102359
Blood Product Expiration Date: 202203102359
Blood Product Expiration Date: 202203122359
Blood Product Expiration Date: 202203122359
Blood Product Expiration Date: 202203132359
Blood Product Expiration Date: 202203162359
Blood Product Expiration Date: 202203162359
Blood Product Expiration Date: 202203162359
Blood Product Expiration Date: 202203162359
Blood Product Expiration Date: 202203182359
Blood Product Expiration Date: 202203182359
ISSUE DATE / TIME: 202202101438
ISSUE DATE / TIME: 202202101438
ISSUE DATE / TIME: 202202111347
ISSUE DATE / TIME: 202202111540
ISSUE DATE / TIME: 202202111700
ISSUE DATE / TIME: 202202111831
ISSUE DATE / TIME: 202202112053
ISSUE DATE / TIME: 202202121041
Unit Type and Rh: 1700
Unit Type and Rh: 1700
Unit Type and Rh: 5100
Unit Type and Rh: 5100
Unit Type and Rh: 5100
Unit Type and Rh: 5100
Unit Type and Rh: 5100
Unit Type and Rh: 5100
Unit Type and Rh: 7300
Unit Type and Rh: 7300
Unit Type and Rh: 7300

## 2020-11-29 LAB — TYPE AND SCREEN
ABO/RH(D): B POS
Antibody Screen: NEGATIVE
Unit division: 0
Unit division: 0
Unit division: 0
Unit division: 0
Unit division: 0
Unit division: 0
Unit division: 0
Unit division: 0
Unit division: 0
Unit division: 0
Unit division: 0

## 2020-11-29 LAB — PREPARE RBC (CROSSMATCH)

## 2020-11-30 LAB — PREPARE RBC (CROSSMATCH)

## 2020-12-01 ENCOUNTER — Encounter: Payer: Self-pay | Admitting: Obstetrics and Gynecology

## 2020-12-01 LAB — SURGICAL PATHOLOGY

## 2020-12-03 ENCOUNTER — Other Ambulatory Visit: Payer: Self-pay

## 2020-12-03 ENCOUNTER — Ambulatory Visit (INDEPENDENT_AMBULATORY_CARE_PROVIDER_SITE_OTHER): Payer: BC Managed Care – PPO | Admitting: Obstetrics and Gynecology

## 2020-12-03 ENCOUNTER — Encounter: Payer: Self-pay | Admitting: Obstetrics and Gynecology

## 2020-12-03 VITALS — BP 105/66 | HR 72 | Ht 67.0 in | Wt 229.9 lb

## 2020-12-03 DIAGNOSIS — Z9889 Other specified postprocedural states: Secondary | ICD-10-CM

## 2020-12-03 NOTE — Progress Notes (Signed)
HPI:      [name] is a 40 y.o. [phone] who LMP was No LMP recorded.  Subjective:   She presents today 1 week from cesarean delivery.  She states she is doing well.  Not using pain medication.  Ambulating voiding eating without difficulty.  She states that she has lost significant amount of fluid weight.  She has discontinued breast-feeding and is not formula feeding.  Still continues to have some mild carpal tunnel but this has improved.  Using braces at night.    Hx: The following portions of the patient's history were reviewed and updated as appropriate:             She  has a past medical history of Depression, Dermatofibrosarcoma protuberans of lower extremity, left, Skin cancer of nose, and Trigeminal neuralgia of right side of face. She does not have any pertinent problems on file. She  has a past surgical history that includes no surgical history and Cesarean section with bilateral tubal ligation (Bilateral, 11/26/2020). Her family history includes Anxiety disorder in her daughter and sister; Dementia in her maternal grandmother; Depression in her father, maternal grandmother, and mother; Diabetes in her mother and son; Heart disease in her paternal grandmother; Hypertension in her father and mother; Melanoma in her father, maternal grandfather, and mother; Skin cancer in her brother; Stroke in her father. She  reports that she has never smoked. She has never used smokeless tobacco. She reports that she does not drink alcohol and does not use drugs. She has a current medication list which includes the following prescription(s): acetaminophen, citalopram, ibuprofen, multivitamin-prenatal, and oxycodone-acetaminophen. She is allergic to ceclor [cefaclor].       Review of Systems:  Review of Systems  Constitutional: Denied constitutional symptoms, night sweats, recent illness, fatigue, fever, insomnia and weight loss.  Eyes: Denied eye symptoms, eye pain, photophobia, vision change  and visual disturbance.  Ears/Nose/Throat/Neck: Denied ear, nose, throat or neck symptoms, hearing loss, nasal discharge, sinus congestion and sore throat.  Cardiovascular: Denied cardiovascular symptoms, arrhythmia, chest pain/pressure, edema, exercise intolerance, orthopnea and palpitations.  Respiratory: Denied pulmonary symptoms, asthma, pleuritic pain, productive sputum, cough, dyspnea and wheezing.  Gastrointestinal: Denied, gastro-esophageal reflux, melena, nausea and vomiting.  Genitourinary: Denied genitourinary symptoms including symptomatic vaginal discharge, pelvic relaxation issues, and urinary complaints.  Musculoskeletal: Denied musculoskeletal symptoms, stiffness, swelling, muscle weakness and myalgia.  Dermatologic: Denied dermatology symptoms, rash and scar.  Neurologic: Denied neurology symptoms, dizziness, headache, neck pain and syncope.  Psychiatric: Denied psychiatric symptoms, anxiety and depression.  Endocrine: Denied endocrine symptoms including hot flashes and night sweats.   Meds:   Current Outpatient Medications on File Prior to Visit  Medication Sig Dispense Refill  . acetaminophen (TYLENOL) 500 MG tablet Take 500 mg by mouth every 6 (six) hours as needed.    . citalopram (CELEXA) 40 MG tablet Take 1 tablet (40 mg total) by mouth daily. (Patient taking differently: Take 40 mg by mouth daily with lunch.) 90 tablet 0  . ibuprofen (ADVIL) 800 MG tablet Take 1 tablet (800 mg total) by mouth every 8 (eight) hours. 30 tablet 0  . Prenatal Vit-Fe Fumarate-FA (MULTIVITAMIN-PRENATAL) 27-0.8 MG TABS tablet Take 1 tablet by mouth every evening.    Marland Kitchen oxyCODONE-acetaminophen (PERCOCET/ROXICET) 5-325 MG tablet Take 1-2 tablets by mouth every 4 (four) hours as needed for moderate pain. (Patient not taking: Reported on 12/03/2020) 20 tablet 0   No current facility-administered medications on file prior to visit.  Objective:     Vitals:   12/03/20 1238  BP:  105/66  Pulse: 72   Filed Weights   12/03/20 1238  Weight: 229 lb 14.4 oz (104.3 kg)               Abdomen: Soft.  Non-tender.  No masses.  No HSM.  Incision/s: Intact.  Healing well.  No erythema.  No drainage.      Assessment:    [id number] Patient Active Problem List   Diagnosis Date Noted  . Acute blood loss as cause of postoperative anemia 11/28/2020  . Malpresentation of fetus, delivered 11/28/2020  . Antepartum placental abruption 11/28/2020  . Post-operative state 11/26/2020  . Uterine fibroid complicating antenatal care, baby not yet delivered, second trimester 05/19/2020  . Supervision of high-risk pregnancy of elderly multigravida 05/19/2020  . History of macrosomia in infant in prior pregnancy, currently pregnant 04/21/2020  . Anxiety and depression 06/08/2017     1. Post-operative state     Patient doing very well.   Plan:            1.  Wound care discussed.  2.  Follow-up 5 weeks. Orders No orders of the defined types were placed in this encounter.   No orders of the defined types were placed in this encounter.     F/U  Return in about 5 weeks (around 01/07/2021).  Finis Bud, M.D. 12/03/2020 12:49 PM

## 2021-01-07 ENCOUNTER — Ambulatory Visit: Payer: BC Managed Care – PPO | Admitting: Obstetrics and Gynecology

## 2021-01-13 ENCOUNTER — Ambulatory Visit (INDEPENDENT_AMBULATORY_CARE_PROVIDER_SITE_OTHER): Payer: BC Managed Care – PPO | Admitting: Obstetrics and Gynecology

## 2021-01-13 ENCOUNTER — Other Ambulatory Visit: Payer: Self-pay

## 2021-01-13 ENCOUNTER — Encounter: Payer: Self-pay | Admitting: Obstetrics and Gynecology

## 2021-01-13 VITALS — BP 135/81 | HR 72 | Ht 67.0 in | Wt 239.4 lb

## 2021-01-13 DIAGNOSIS — Z9889 Other specified postprocedural states: Secondary | ICD-10-CM

## 2021-01-13 NOTE — Progress Notes (Signed)
HPI:      [name] is a 40 y.o. [phone] who LMP was No LMP recorded. (Menstrual status: Other).  Subjective:   She presents today approximately 5 weeks from cesarean delivery with tubal ligation.  She reports she is doing well.  She has occasional spotting but no other issues.  She denies pelvic/abdominal pain.  Bowel movements voiding eating without difficulty.  She is currently bottlefeeding. Carpal tunnel is slowly resolving.   Hx: The following portions of the patient's history were reviewed and updated as appropriate:             She  has a past medical history of Depression, Dermatofibrosarcoma protuberans of lower extremity, left, Skin cancer of nose, and Trigeminal neuralgia of right side of face. She does not have any pertinent problems on file. She  has a past surgical history that includes no surgical history and Cesarean section with bilateral tubal ligation (Bilateral, 11/26/2020). Her family history includes Anxiety disorder in her daughter and sister; Dementia in her maternal grandmother; Depression in her father, maternal grandmother, and mother; Diabetes in her mother and son; Heart disease in her paternal grandmother; Hypertension in her father and mother; Melanoma in her father, maternal grandfather, and mother; Skin cancer in her brother; Stroke in her father. She  reports that she has never smoked. She has never used smokeless tobacco. She reports that she does not drink alcohol and does not use drugs. She has a current medication list which includes the following prescription(s): acetaminophen, citalopram, multivitamin-prenatal, ibuprofen, and oxycodone-acetaminophen. She is allergic to ceclor [cefaclor].       Review of Systems:  Review of Systems  Constitutional: Denied constitutional symptoms, night sweats, recent illness, fatigue, fever, insomnia and weight loss.  Eyes: Denied eye symptoms, eye pain, photophobia, vision change and visual disturbance.   Ears/Nose/Throat/Neck: Denied ear, nose, throat or neck symptoms, hearing loss, nasal discharge, sinus congestion and sore throat.  Cardiovascular: Denied cardiovascular symptoms, arrhythmia, chest pain/pressure, edema, exercise intolerance, orthopnea and palpitations.  Respiratory: Denied pulmonary symptoms, asthma, pleuritic pain, productive sputum, cough, dyspnea and wheezing.  Gastrointestinal: Denied, gastro-esophageal reflux, melena, nausea and vomiting.  Genitourinary: Denied genitourinary symptoms including symptomatic vaginal discharge, pelvic relaxation issues, and urinary complaints.  Musculoskeletal: Denied musculoskeletal symptoms, stiffness, swelling, muscle weakness and myalgia.  Dermatologic: Denied dermatology symptoms, rash and scar.  Neurologic: Denied neurology symptoms, dizziness, headache, neck pain and syncope.  Psychiatric: Denied psychiatric symptoms, anxiety and depression.  Endocrine: Denied endocrine symptoms including hot flashes and night sweats.   Meds:   Current Outpatient Medications on File Prior to Visit  Medication Sig Dispense Refill  . acetaminophen (TYLENOL) 500 MG tablet Take 500 mg by mouth every 6 (six) hours as needed.    . citalopram (CELEXA) 40 MG tablet Take 1 tablet (40 mg total) by mouth daily. (Patient taking differently: Take 40 mg by mouth daily with lunch.) 90 tablet 0  . Prenatal Vit-Fe Fumarate-FA (MULTIVITAMIN-PRENATAL) 27-0.8 MG TABS tablet Take 1 tablet by mouth every evening.    Marland Kitchen ibuprofen (ADVIL) 800 MG tablet Take 1 tablet (800 mg total) by mouth every 8 (eight) hours. (Patient not taking: Reported on 01/13/2021) 30 tablet 0  . oxyCODONE-acetaminophen (PERCOCET/ROXICET) 5-325 MG tablet Take 1-2 tablets by mouth every 4 (four) hours as needed for moderate pain. (Patient not taking: No sig reported) 20 tablet 0   No current facility-administered medications on file prior to visit.       The pregnancy intention screening data noted  above was reviewed. Potential methods of contraception were discussed. The patient elected to proceed with Female Sterilization.     Objective:     Vitals:   01/13/21 1003  BP: 135/81  Pulse: 72   Filed Weights   01/13/21 1003  Weight: 239 lb 6.4 oz (108.6 kg)               Abdomen: Soft.  Non-tender.  No masses.  No HSM.  Incision/s: Intact.  Healing well.  No erythema.  No drainage.   Physical examination   Pelvic:   Vulva: Normal appearance.  No lesions.  Vagina: No lesions or abnormalities noted.  Support: Normal pelvic support.  Urethra No masses tenderness or scarring.  Meatus Normal size without lesions or prolapse.  Cervix: Normal appearance.  No lesions.  Anus: Normal exam.  No lesions.  Perineum: Normal exam.  No lesions.        Bimanual   Uterus: Normal size.  Non-tender.  Mobile.  AV.  Adnexae: No masses.  Non-tender to palpation.  Cul-de-sac: Negative for abnormality.      Assessment:    [id number] Patient Active Problem List   Diagnosis Date Noted  . Acute blood loss as cause of postoperative anemia 11/28/2020  . Malpresentation of fetus, delivered 11/28/2020  . Antepartum placental abruption 11/28/2020  . Post-operative state 11/26/2020  . Uterine fibroid complicating antenatal care, baby not yet delivered, second trimester 05/19/2020  . Supervision of high-risk pregnancy of elderly multigravida 05/19/2020  . History of macrosomia in infant in prior pregnancy, currently pregnant 04/21/2020  . Anxiety and depression 06/08/2017     1. Post-operative state   2. Postpartum care and examination immediately after delivery     Patient doing very well postop.   Plan:            1.  May resume normal activities with exception of heavy lifting  2.  Annual exam, Pap, mammogram in July. Orders No orders of the defined types were placed in this encounter.   No orders of the defined types were placed in this encounter.     F/U  Return in about 4  months (around 05/15/2021) for Annual Physical.  Finis Bud, M.D. 01/13/2021 10:44 AM

## 2021-01-21 ENCOUNTER — Encounter: Payer: Self-pay | Admitting: Obstetrics and Gynecology

## 2021-02-11 ENCOUNTER — Other Ambulatory Visit: Payer: Self-pay | Admitting: Surgical

## 2021-02-11 MED ORDER — CITALOPRAM HYDROBROMIDE 40 MG PO TABS: 40.0000 mg | ORAL_TABLET | Freq: Every day | ORAL | 0 refills | Status: DC

## 2021-02-15 DIAGNOSIS — Z0289 Encounter for other administrative examinations: Secondary | ICD-10-CM

## 2021-04-28 ENCOUNTER — Encounter: Payer: BC Managed Care – PPO | Admitting: Adult Health

## 2021-05-14 ENCOUNTER — Other Ambulatory Visit: Payer: BC Managed Care – PPO

## 2021-05-14 ENCOUNTER — Encounter: Payer: BC Managed Care – PPO | Admitting: Obstetrics and Gynecology

## 2021-05-21 ENCOUNTER — Other Ambulatory Visit: Payer: BC Managed Care – PPO

## 2021-05-21 ENCOUNTER — Encounter: Payer: BC Managed Care – PPO | Admitting: Obstetrics and Gynecology

## 2021-05-26 ENCOUNTER — Other Ambulatory Visit: Payer: Self-pay

## 2021-05-26 ENCOUNTER — Other Ambulatory Visit: Payer: BC Managed Care – PPO

## 2021-05-26 ENCOUNTER — Ambulatory Visit (INDEPENDENT_AMBULATORY_CARE_PROVIDER_SITE_OTHER): Payer: BC Managed Care – PPO | Admitting: Obstetrics and Gynecology

## 2021-05-26 ENCOUNTER — Encounter: Payer: Self-pay | Admitting: Obstetrics and Gynecology

## 2021-05-26 VITALS — BP 123/63 | HR 60 | Resp 16 | Ht 67.0 in | Wt 250.9 lb

## 2021-05-26 DIAGNOSIS — D219 Benign neoplasm of connective and other soft tissue, unspecified: Secondary | ICD-10-CM | POA: Diagnosis not present

## 2021-05-26 DIAGNOSIS — Z1231 Encounter for screening mammogram for malignant neoplasm of breast: Secondary | ICD-10-CM

## 2021-05-26 DIAGNOSIS — Z01419 Encounter for gynecological examination (general) (routine) without abnormal findings: Secondary | ICD-10-CM | POA: Diagnosis not present

## 2021-05-26 DIAGNOSIS — F32A Depression, unspecified: Secondary | ICD-10-CM

## 2021-05-26 MED ORDER — SERTRALINE HCL 100 MG PO TABS: 100.0000 mg | ORAL_TABLET | Freq: Every day | ORAL | 0 refills | Status: DC

## 2021-05-26 NOTE — Progress Notes (Signed)
HPI:      [name] is a 40 y.o. [id number] who LMP was Patient's last menstrual period was 05/25/2021 (exact date).  Subjective:   She presents today for her annual examination.  She is doing well.  She reports that her baby is doing great-loved by all, a little Princess. [name] has resumed her periods.  She is not having significant problems with heavy bleeding or severe cramping despite a known uterine fibroid.  She does have some questions regarding the natural course and history of uterine fibroids. She continues to take Zoloft daily for her depression/anxiety and she says this is doing well. She has a tubal ligation for birth control    Hx: The following portions of the patient's history were reviewed and updated as appropriate:             She  has a past medical history of Depression, Dermatofibrosarcoma protuberans of lower extremity, left, Skin cancer of nose, and Trigeminal neuralgia of right side of face. She does not have any pertinent problems on file. She  has a past surgical history that includes no surgical history and Cesarean section with bilateral tubal ligation (Bilateral, 11/26/2020). Her family history includes Anxiety disorder in her daughter and sister; Dementia in her maternal grandmother; Depression in her father, maternal grandmother, and mother; Diabetes in her mother and son; Heart disease in her paternal grandmother; Hypertension in her father and mother; Melanoma in her father, maternal grandfather, and mother; Skin cancer in her brother; Stroke in her father. She  reports that she has never smoked. She has never used smokeless tobacco. She reports that she does not drink alcohol and does not use drugs. She has a current medication list which includes the following prescription(s): acetaminophen, citalopram, multivitamin-prenatal, sertraline, ibuprofen, and oxycodone-acetaminophen. She is allergic to ceclor [cefaclor].       Review of Systems:  Review of  Systems  Constitutional: Denied constitutional symptoms, night sweats, recent illness, fatigue, fever, insomnia and weight loss.  Eyes: Denied eye symptoms, eye pain, photophobia, vision change and visual disturbance.  Ears/Nose/Throat/Neck: Denied ear, nose, throat or neck symptoms, hearing loss, nasal discharge, sinus congestion and sore throat.  Cardiovascular: Denied cardiovascular symptoms, arrhythmia, chest pain/pressure, edema, exercise intolerance, orthopnea and palpitations.  Respiratory: Denied pulmonary symptoms, asthma, pleuritic pain, productive sputum, cough, dyspnea and wheezing.  Gastrointestinal: Denied, gastro-esophageal reflux, melena, nausea and vomiting.  Genitourinary: Denied genitourinary symptoms including symptomatic vaginal discharge, pelvic relaxation issues, and urinary complaints.  Musculoskeletal: Denied musculoskeletal symptoms, stiffness, swelling, muscle weakness and myalgia.  Dermatologic: Denied dermatology symptoms, rash and scar.  Neurologic: Denied neurology symptoms, dizziness, headache, neck pain and syncope.  Psychiatric: Denied psychiatric symptoms, anxiety and depression.  Endocrine: Denied endocrine symptoms including hot flashes and night sweats.   Meds:   Current Outpatient Medications on File Prior to Visit  Medication Sig Dispense Refill   acetaminophen (TYLENOL) 500 MG tablet Take 500 mg by mouth every 6 (six) hours as needed.     citalopram (CELEXA) 40 MG tablet Take 1 tablet (40 mg total) by mouth daily. 90 tablet 0   Prenatal Vit-Fe Fumarate-FA (MULTIVITAMIN-PRENATAL) 27-0.8 MG TABS tablet Take 1 tablet by mouth every evening.     ibuprofen (ADVIL) 800 MG tablet Take 1 tablet (800 mg total) by mouth every 8 (eight) hours. (Patient not taking: No sig reported) 30 tablet 0   oxyCODONE-acetaminophen (PERCOCET/ROXICET) 5-325 MG tablet Take 1-2 tablets by mouth every 4 (four) hours as needed for moderate pain. (  Patient not taking: No sig reported)  20 tablet 0   No current facility-administered medications on file prior to visit.       Objective:     Vitals:   05/26/21 1002  BP: 123/63  Pulse: 60  Resp: 16    Filed Weights   05/26/21 1002  Weight: 250 lb 14.4 oz (113.8 kg)              Physical examination General NAD, Conversant  HEENT Atraumatic; Op clear with mmm.  Normo-cephalic. Pupils reactive. Anicteric sclerae  Thyroid/Neck Smooth without nodularity or enlargement. Normal ROM.  Neck Supple.  Skin No rashes, lesions or ulceration. Normal palpated skin turgor. No nodularity.  Breasts: No masses or discharge.  Symmetric.  No axillary adenopathy.  Lungs: Clear to auscultation.No rales or wheezes. Normal Respiratory effort, no retractions.  Heart: NSR.  No murmurs or rubs appreciated. No periferal edema  Abdomen: Soft.  Non-tender.  No masses.  No HSM. No hernia  Extremities: Moves all appropriately.  Normal ROM for age. No lymphadenopathy.  Neuro: Oriented to PPT.  Normal mood. Normal affect.     Pelvic: Patient declined pelvic examination because she began her menses today.    Assessment:    [id number] Patient Active Problem List   Diagnosis Date Noted   Acute blood loss as cause of postoperative anemia 11/28/2020   Malpresentation of fetus, delivered 11/28/2020   Antepartum placental abruption 11/28/2020   Post-operative state 11/26/2020   Uterine fibroid complicating antenatal care, baby not yet delivered, second trimester 05/19/2020   Supervision of high-risk pregnancy of elderly multigravida 05/19/2020   History of macrosomia in infant in prior pregnancy, currently pregnant 04/21/2020   Anxiety and depression 06/08/2017     1. Encounter for screening mammogram for malignant neoplasm of breast   2. Well woman exam with routine gynecological exam   3. Depression, unspecified depression type   4. Fibroids     Doing well.  Taking Zoloft and it is working for her depression.  Has resumed normal  menstrual periods   Plan:            1.  Basic Screening Recommendations The basic screening recommendations for asymptomatic women were discussed with the patient during her visit.  The age-appropriate recommendations were discussed with her and the rational for the tests reviewed.  When I am informed by the patient that another primary care physician has previously obtained the age-appropriate tests and they are up-to-date, only outstanding tests are ordered and referrals given as necessary.  Abnormal results of tests will be discussed with her when all of her results are completed.  Routine preventative health maintenance measures emphasized: Exercise/Diet/Weight control, Tobacco Warnings, Alcohol/Substance use risks and Stress Management Mammogram ordered-blood work today 2.  Fibroids Uterine fibroids were discussed in detail.  The natural course and history of fibroids were reviewed.  Multiple treatment options were also discussed including NSAIDS, hormonal options and hysterectomy.  Menopause and its effect on fibroids was also reviewed.   Orders Orders Placed This Encounter  Procedures   MM 3D SCREEN BREAST BILATERAL   Hemoglobin A1c   Lipid panel   TSH   CBC   Basic metabolic panel     Meds ordered this encounter  Medications   sertraline (ZOLOFT) 100 MG tablet    Sig: Take 1 tablet (100 mg total) by mouth daily.    Dispense:  90 tablet    Refill:  0  F/U  Return in about 1 year (around 05/26/2022) for Annual Physical.  Finis Bud, M.D. 05/26/2021 10:35 AM

## 2021-05-27 LAB — TSH: TSH: 1.12 u[IU]/mL (ref 0.450–4.500)

## 2021-05-27 LAB — LIPID PANEL
Chol/HDL Ratio: 4 ratio (ref 0.0–4.4)
Cholesterol, Total: 171 mg/dL (ref 100–199)
HDL: 43 mg/dL (ref >39)
LDL Chol Calc (NIH): 109 mg/dL — ABNORMAL HIGH (ref 0–99)
Triglycerides: 104 mg/dL (ref 0–149)
VLDL Cholesterol Cal: 19 mg/dL (ref 5–40)

## 2021-05-27 LAB — BASIC METABOLIC PANEL
BUN/Creatinine Ratio: 16 (ref 9–23)
BUN: 12 mg/dL (ref 6–24)
CO2: 22 mmol/L (ref 20–29)
Calcium: 9.4 mg/dL (ref 8.7–10.2)
Chloride: 102 mmol/L (ref 96–106)
Creatinine, Ser: 0.73 mg/dL (ref 0.57–1.00)
Glucose: 81 mg/dL (ref 65–99)
Potassium: 4.8 mmol/L (ref 3.5–5.2)
Sodium: 140 mmol/L (ref 134–144)
eGFR: 105 mL/min/{1.73_m2} (ref >59)

## 2021-05-27 LAB — CBC
Hematocrit: 40.2 % (ref 34.0–46.6)
Hemoglobin: 13 g/dL (ref 11.1–15.9)
MCH: 25.4 pg — ABNORMAL LOW (ref 26.6–33.0)
MCHC: 32.3 g/dL (ref 31.5–35.7)
MCV: 79 fL (ref 79–97)
Platelets: 280 10*3/uL (ref 150–450)
RBC: 5.12 x10E6/uL (ref 3.77–5.28)
RDW: 15.5 % — ABNORMAL HIGH (ref 11.7–15.4)
WBC: 7.9 10*3/uL (ref 3.4–10.8)

## 2021-05-27 LAB — HEMOGLOBIN A1C
Est. average glucose Bld gHb Est-mCnc: 103 mg/dL
Hgb A1c MFr Bld: 5.2 % (ref 4.8–5.6)

## 2021-06-02 IMAGING — DX DG ABDOMEN 1V
3 series · 3 of 3 positions shown · non-contrast
Comparison: None.

CLINICAL DATA: 39-year-old female status post emergency C-section.
Prior chronic scarring trochanter.

EXAM:
ABDOMEN - 1 VIEW

[abdomen supine (1 of 3)]
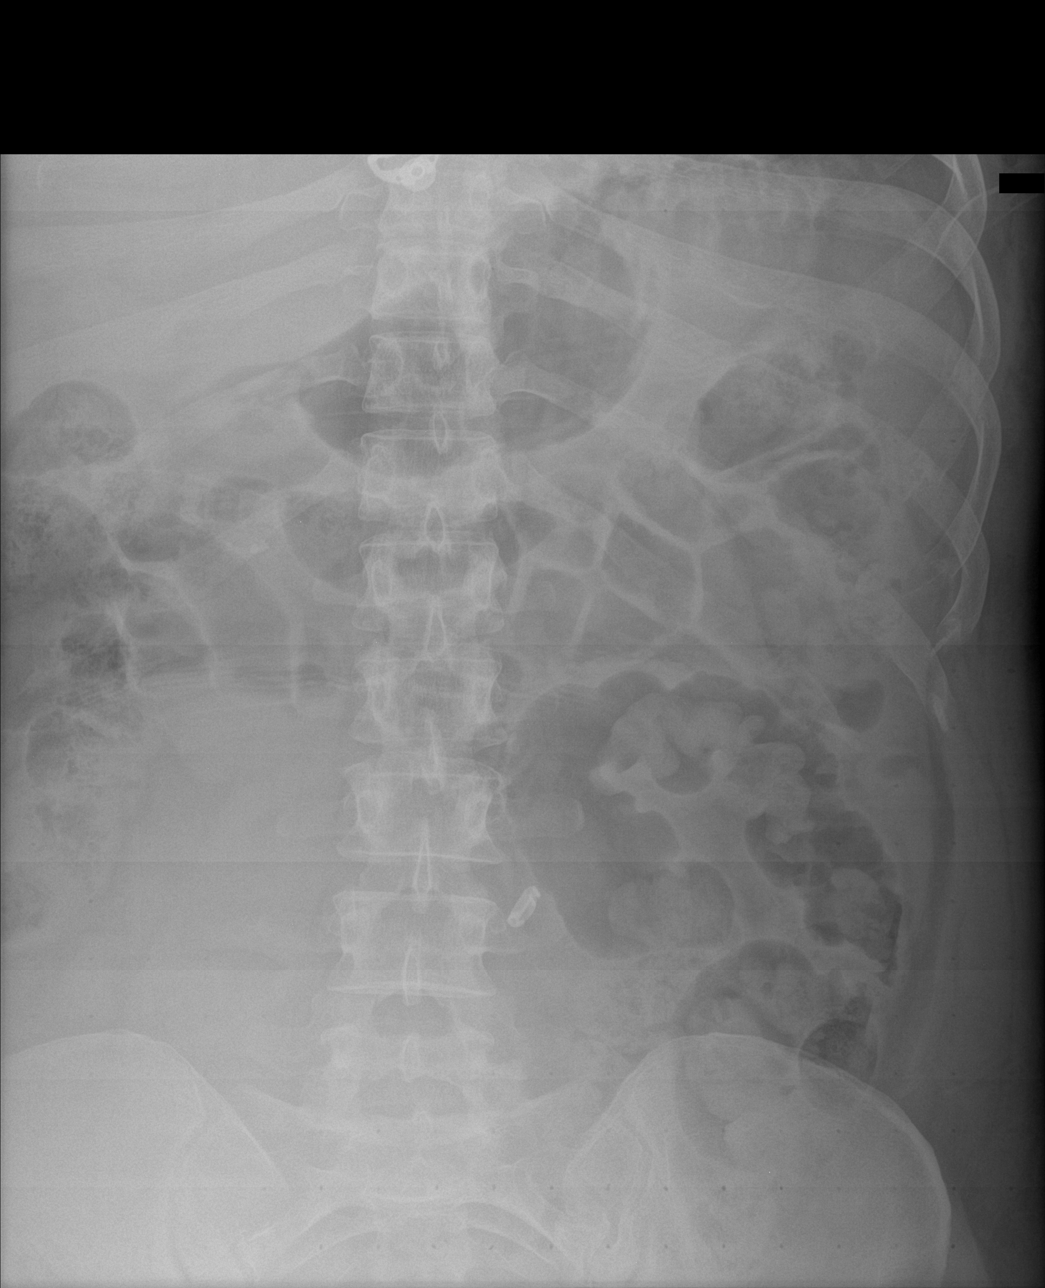

[abdomen supine (2 of 3)]
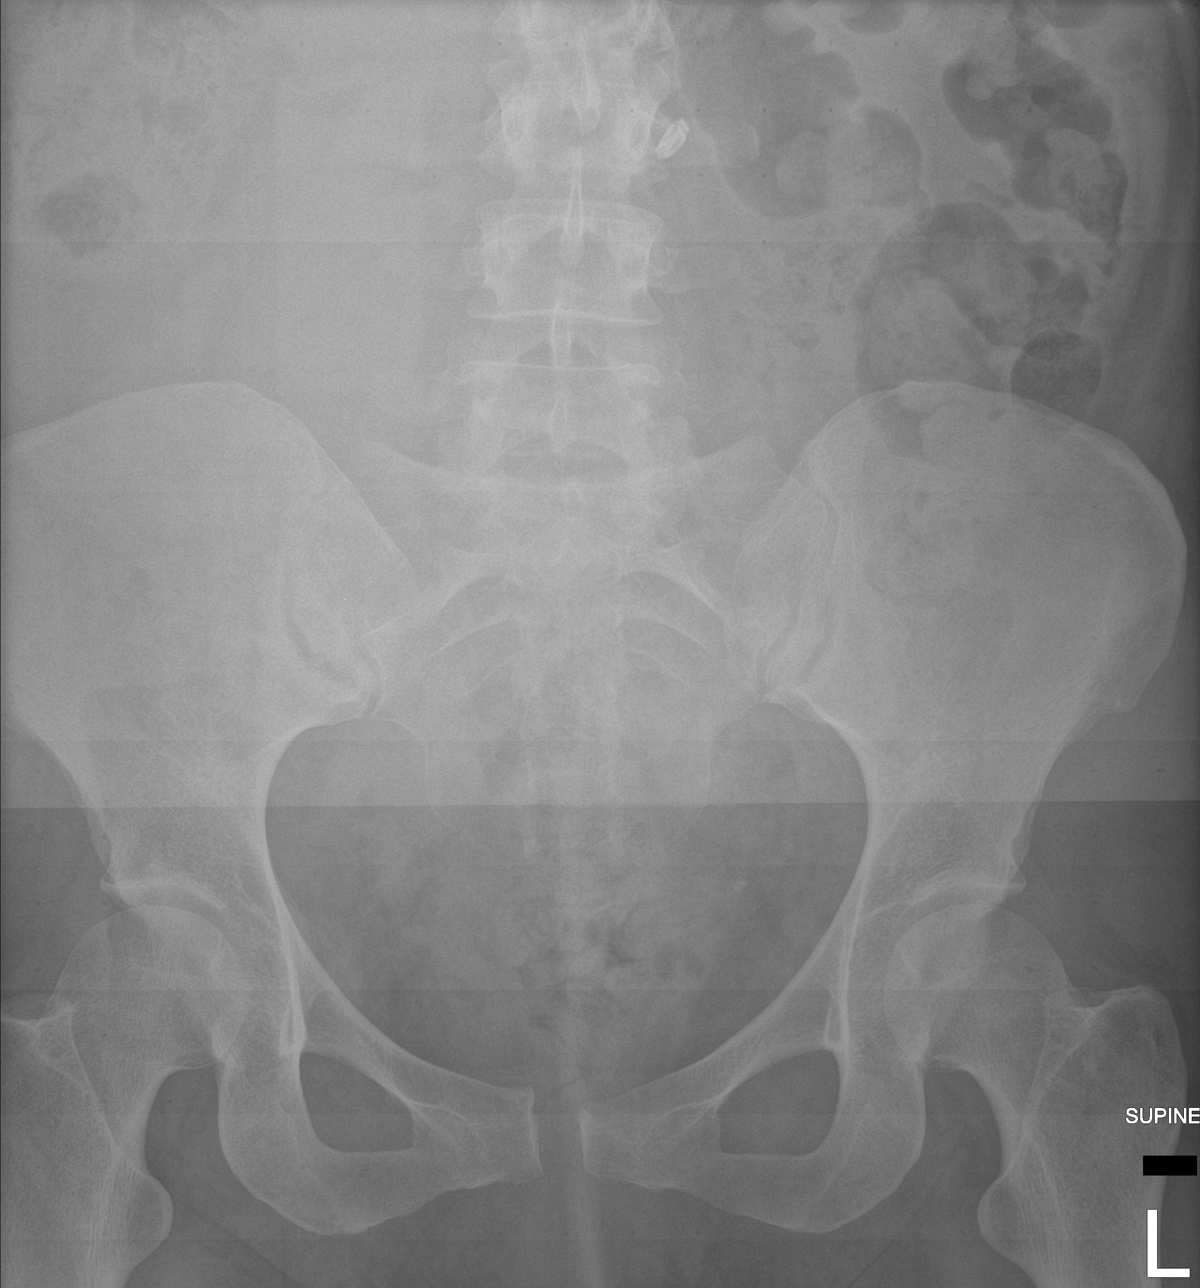

[abdomen supine (3 of 3)]
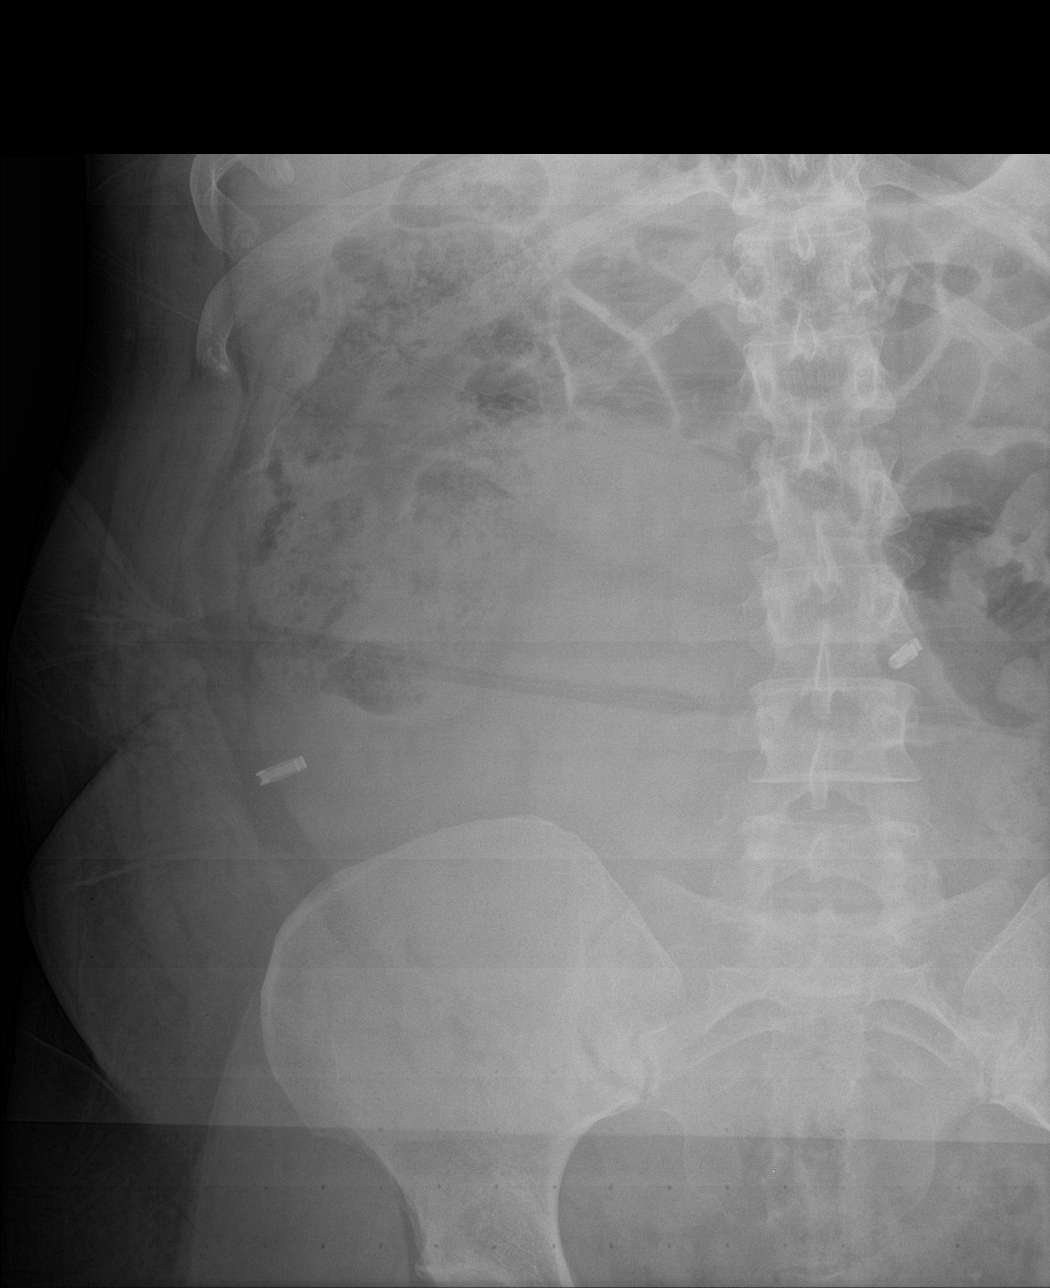

[3 of 3 positions shown; findings below may reference images not displayed]

FINDINGS: There are 2 radiopaque clips in the lower abdomen, 1 in the right
lower quadrant and the other at the L3-L4 disc space to the left of
the spine. These likely represent displaced tubal ligation clips. No
other radiopaque foreign object identified. There is an area of
density in the right lower quadrant with mass effect and
displacement of the bowel. Likely represents the gravid uterus. A
mass, fluid collection, or hematoma is not excluded.

No bowel dilatation or evidence of obstruction. No free air or
radiopaque calculi. The osseous structures are intact.
IMPRESSION: 1. Probable displaced tubal ligation clips in the lower abdomen. No
other radiopaque foreign object identified.
2. Mass effect and displacement of the bowel in the right lower
quadrant, likely due to the gravid uterus.

## 2021-06-03 ENCOUNTER — Encounter: Payer: Self-pay | Admitting: Adult Health

## 2021-06-28 ENCOUNTER — Encounter: Payer: BC Managed Care – PPO | Admitting: Adult Health

## 2021-08-27 ENCOUNTER — Other Ambulatory Visit: Payer: Self-pay

## 2021-08-28 ENCOUNTER — Other Ambulatory Visit: Payer: Self-pay | Admitting: Obstetrics and Gynecology

## 2021-08-28 DIAGNOSIS — F32A Depression, unspecified: Secondary | ICD-10-CM

## 2021-09-02 ENCOUNTER — Other Ambulatory Visit: Payer: Self-pay

## 2021-09-02 MED ORDER — CITALOPRAM HYDROBROMIDE 40 MG PO TABS: 40.0000 mg | ORAL_TABLET | Freq: Every day | ORAL | 3 refills | Status: DC

## 2021-09-07 ENCOUNTER — Other Ambulatory Visit: Payer: Self-pay

## 2021-09-07 DIAGNOSIS — F32A Depression, unspecified: Secondary | ICD-10-CM

## 2021-09-07 MED ORDER — SERTRALINE HCL 100 MG PO TABS: 100.0000 mg | ORAL_TABLET | Freq: Every day | ORAL | 3 refills | Status: AC

## 2021-11-10 LAB — FETAL NONSTRESS TEST

## 2022-05-27 ENCOUNTER — Encounter: Payer: PRIVATE HEALTH INSURANCE | Admitting: Obstetrics and Gynecology

## 2022-07-25 ENCOUNTER — Encounter: Payer: Self-pay | Admitting: Obstetrics and Gynecology

## 2022-08-25 ENCOUNTER — Ambulatory Visit
Admission: RE | Admit: 2022-08-25 | Discharge: 2022-08-25 | Disposition: A | Discharge status: Home / Self Care | Payer: BC Managed Care – PPO | Source: Ambulatory Visit | Attending: Obstetrics and Gynecology | Admitting: Obstetrics and Gynecology

## 2022-08-25 DIAGNOSIS — Z1231 Encounter for screening mammogram for malignant neoplasm of breast: Secondary | ICD-10-CM | POA: Diagnosis present
